# Patient Record
Sex: Male | Born: 1970 | Race: Asian | Hispanic: No
Health system: Southern US, Community
[De-identification: ages and names within clinical notes are randomized; demographics above are authoritative.]

## PROBLEM LIST (undated history)

## (undated) DIAGNOSIS — I10 Essential (primary) hypertension: Secondary | ICD-10-CM

---

## 2005-08-31 ENCOUNTER — Emergency Department (HOSPITAL_COMMUNITY): Admission: EM | Admit: 2005-08-31 | Discharge: 2005-08-31 | Payer: Self-pay | Admitting: Emergency Medicine

## 2006-06-15 IMAGING — CR DG FOOT COMPLETE 3+V*L*
3 series · 3 of 3 positions shown · non-contrast
Comparison: none

CLINICAL DATA: Fall.  Left ankle and foot injury.  Foot pain.
 LEFT FOOT ? 3 VIEW:
 There is no evidence of fracture or dislocation.  Small plantar calcaneal spur is noted.  No other significant bone abnormalities are identified.

[t foot ap left]
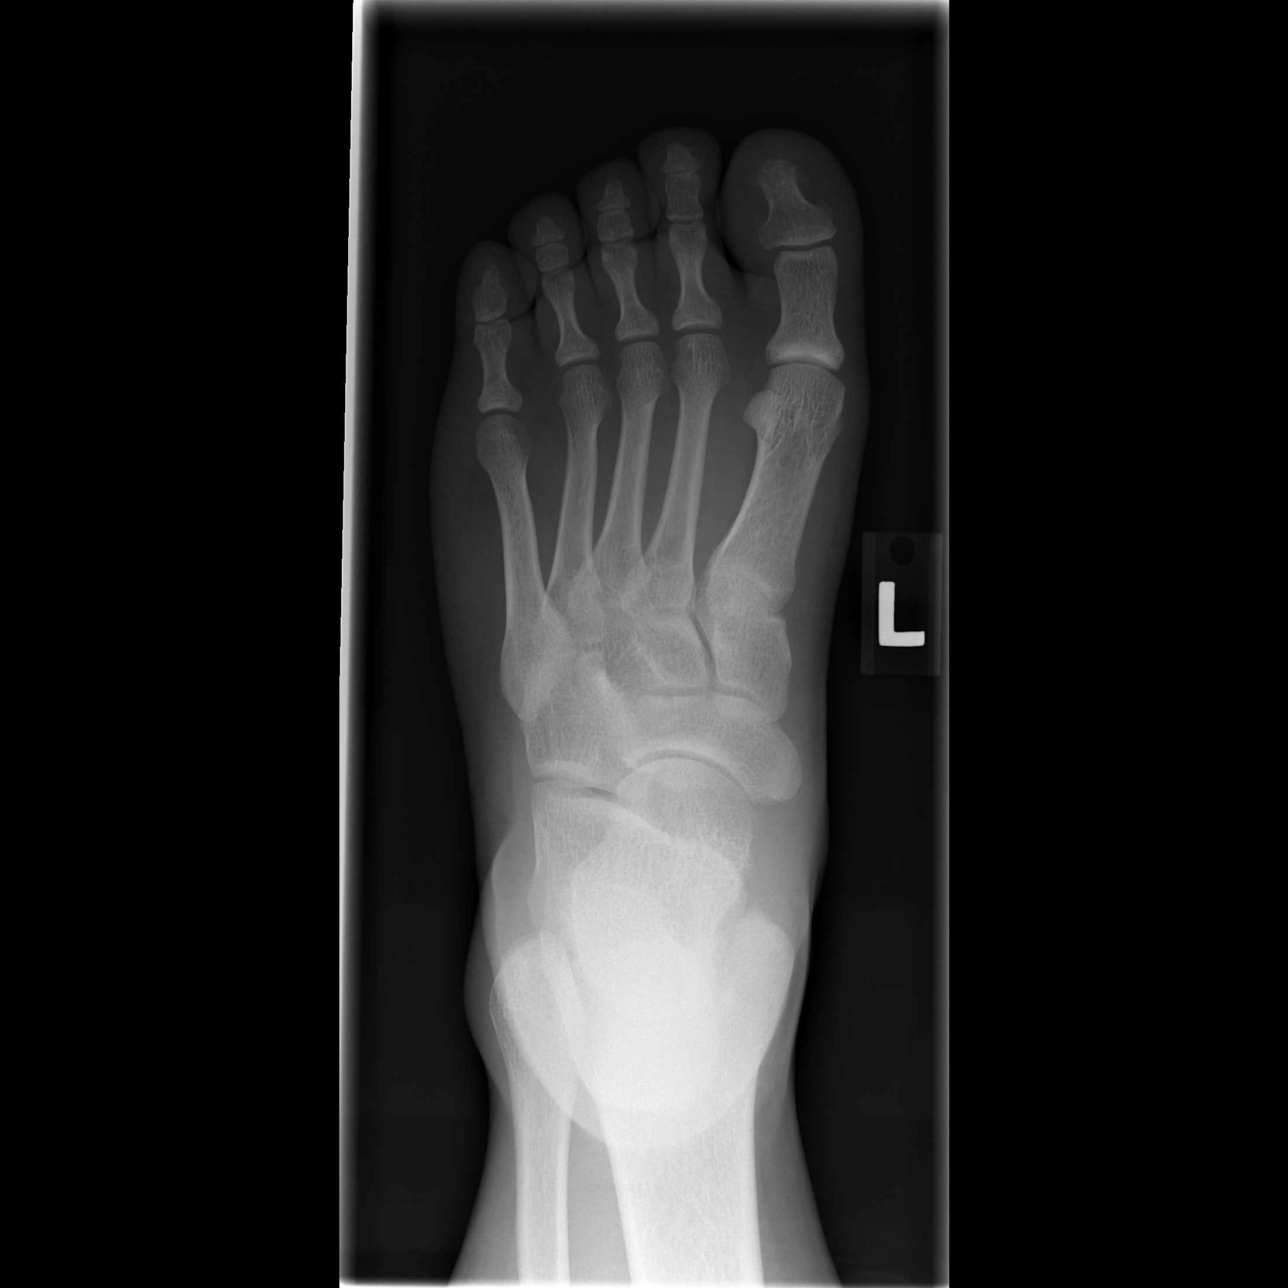

[t foot oblique left]
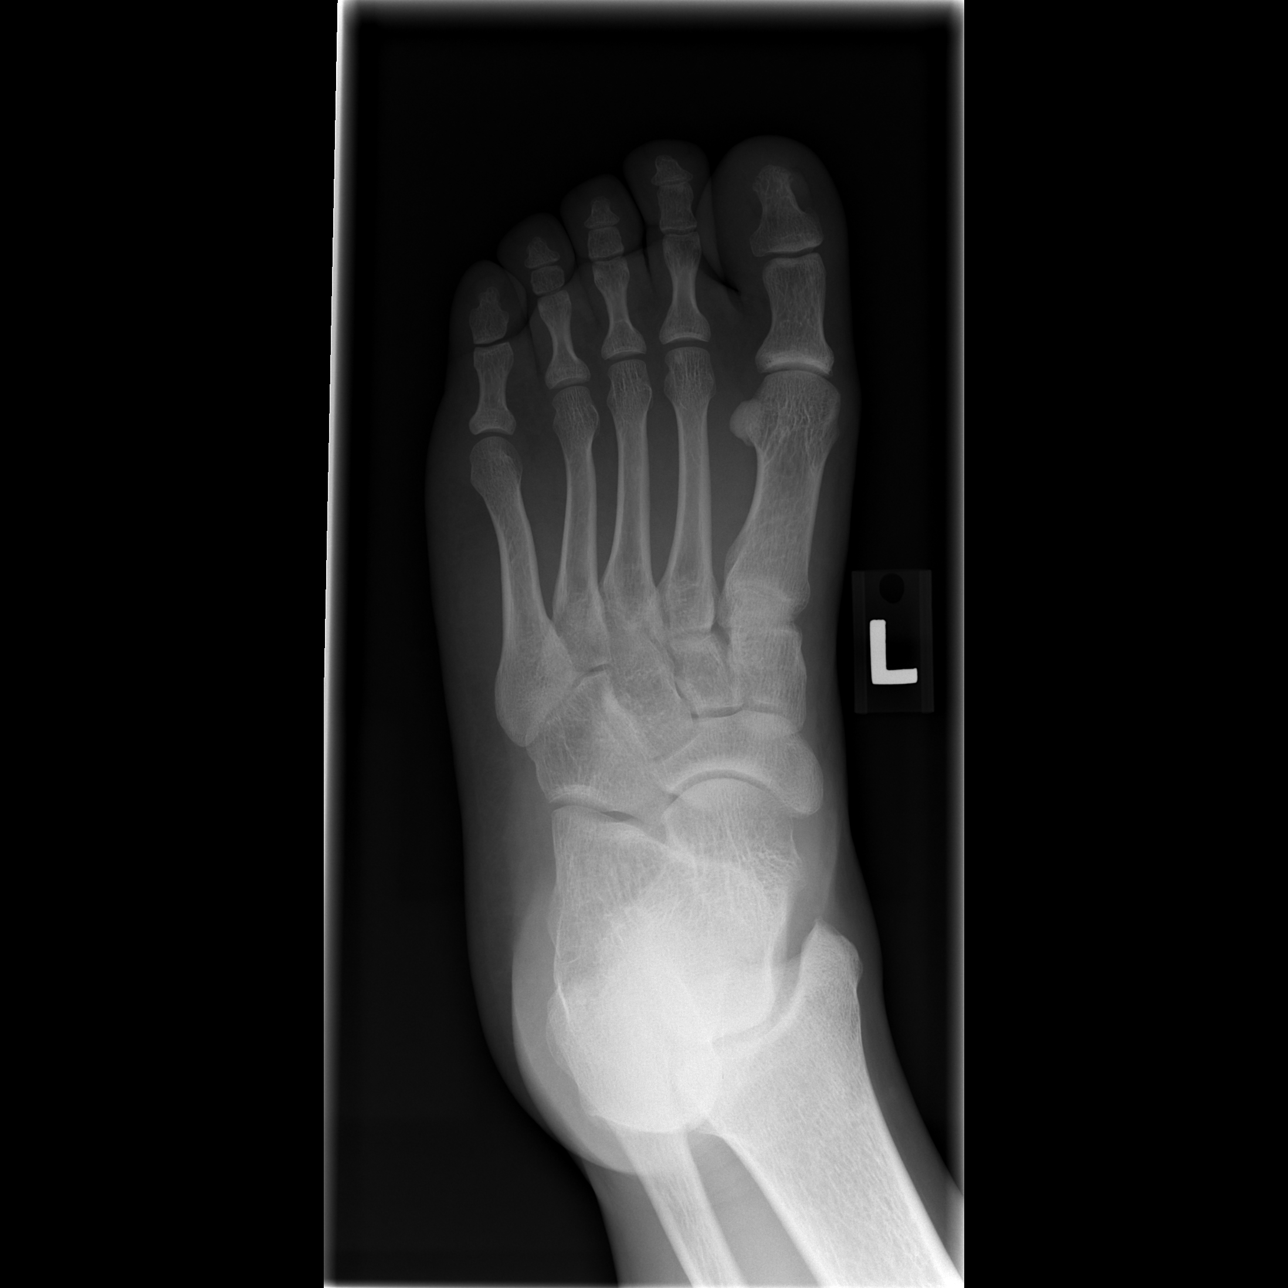

[t foot lat left]
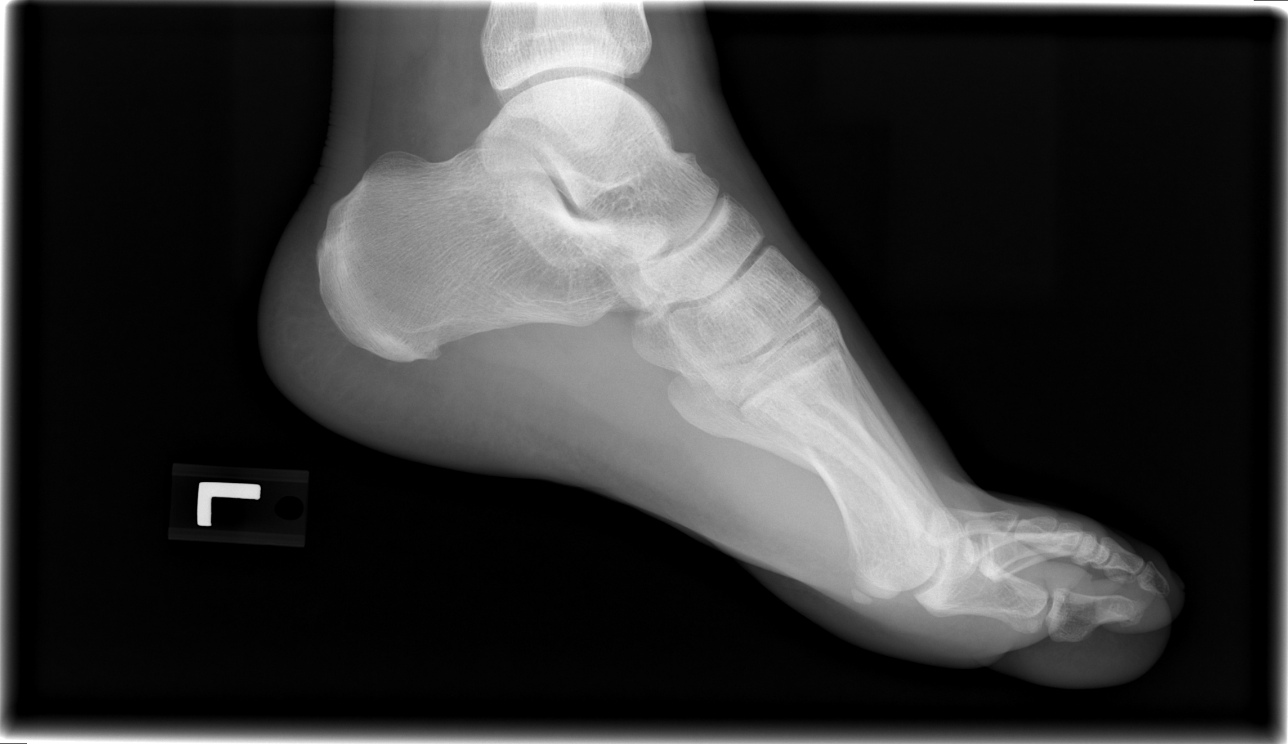

[3 of 3 positions shown; findings below may reference images not displayed]

IMPRESSION: 1.  No acute findings.  
 2.  Small plantar calcaneal spur.

## 2006-06-15 IMAGING — CR DG ANKLE COMPLETE 3+V*L*
3 series · 3 of 3 positions shown · non-contrast
Comparison: none

CLINICAL DATA: Fall.  Left ankle injury and pain.
 LEFT ANKLE ? 3 VIEW:
 There is no evidence of fracture, dislocation, or joint effusion.  There is no evidence of arthropathy or other focal bone abnormality.  Soft tissues are unremarkable.

[t ankle joint ap left]
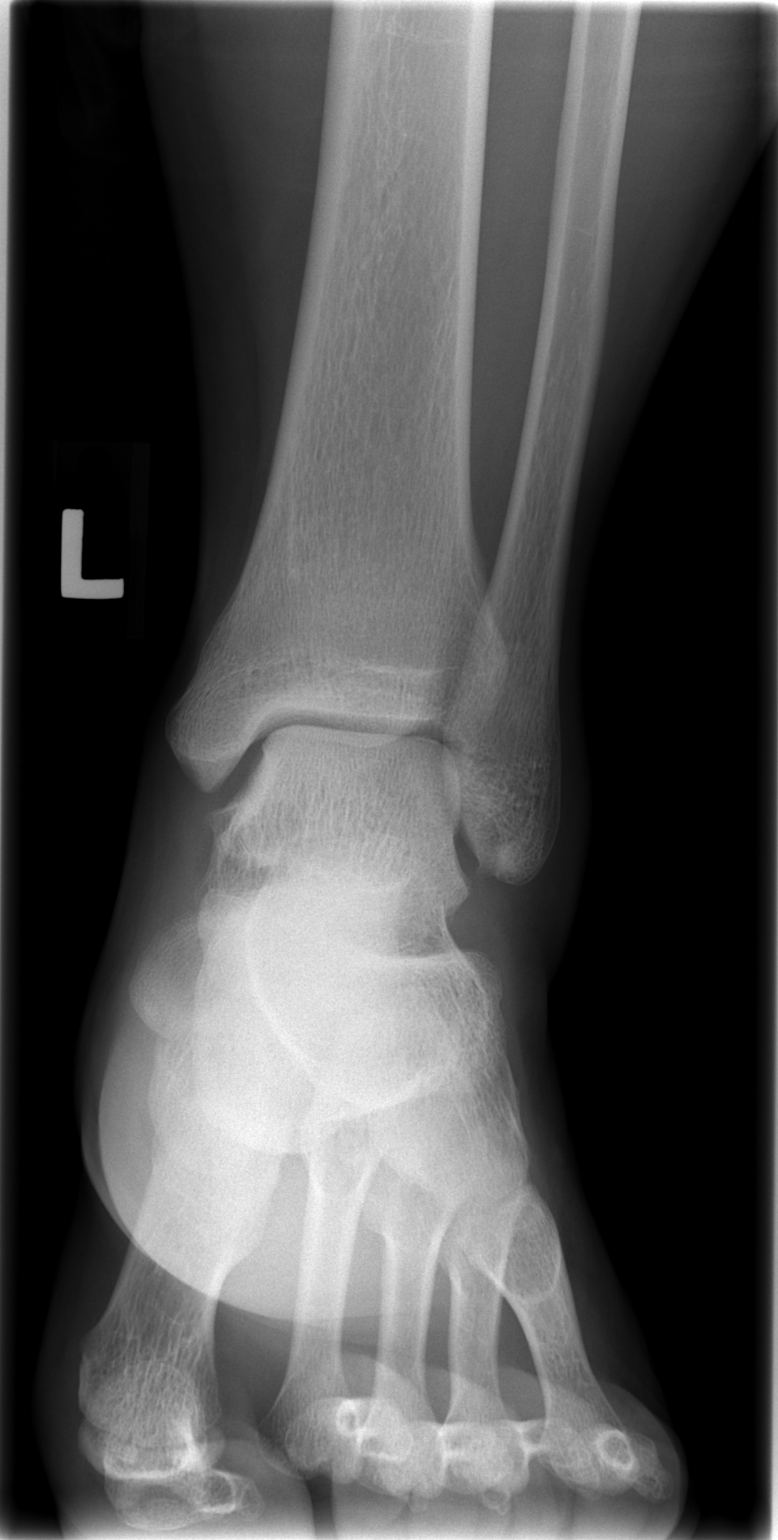

[t ankle joint oblique left]
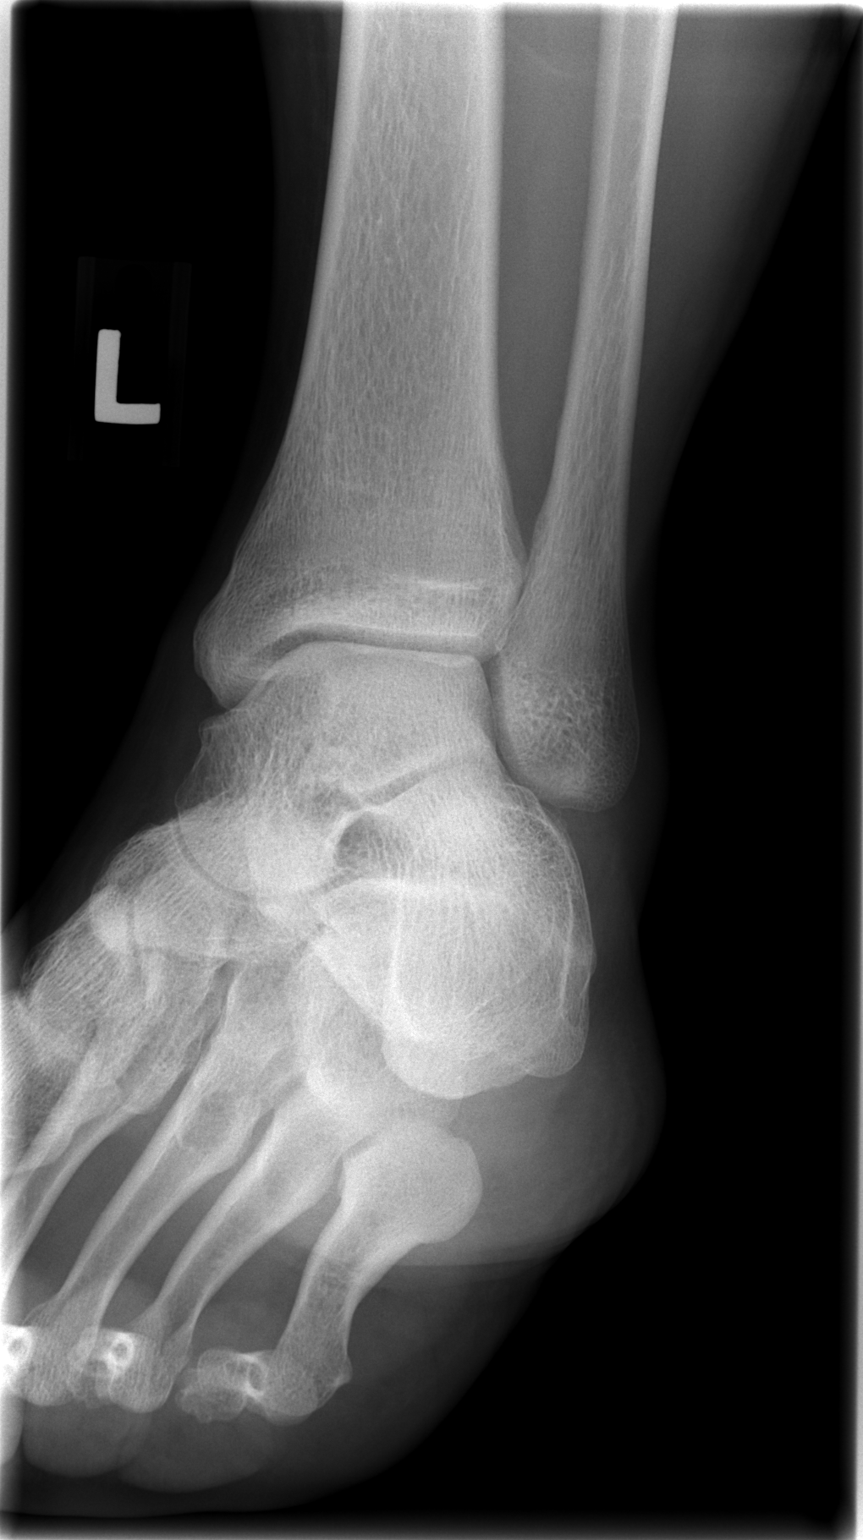

[t ankle joint lat left]
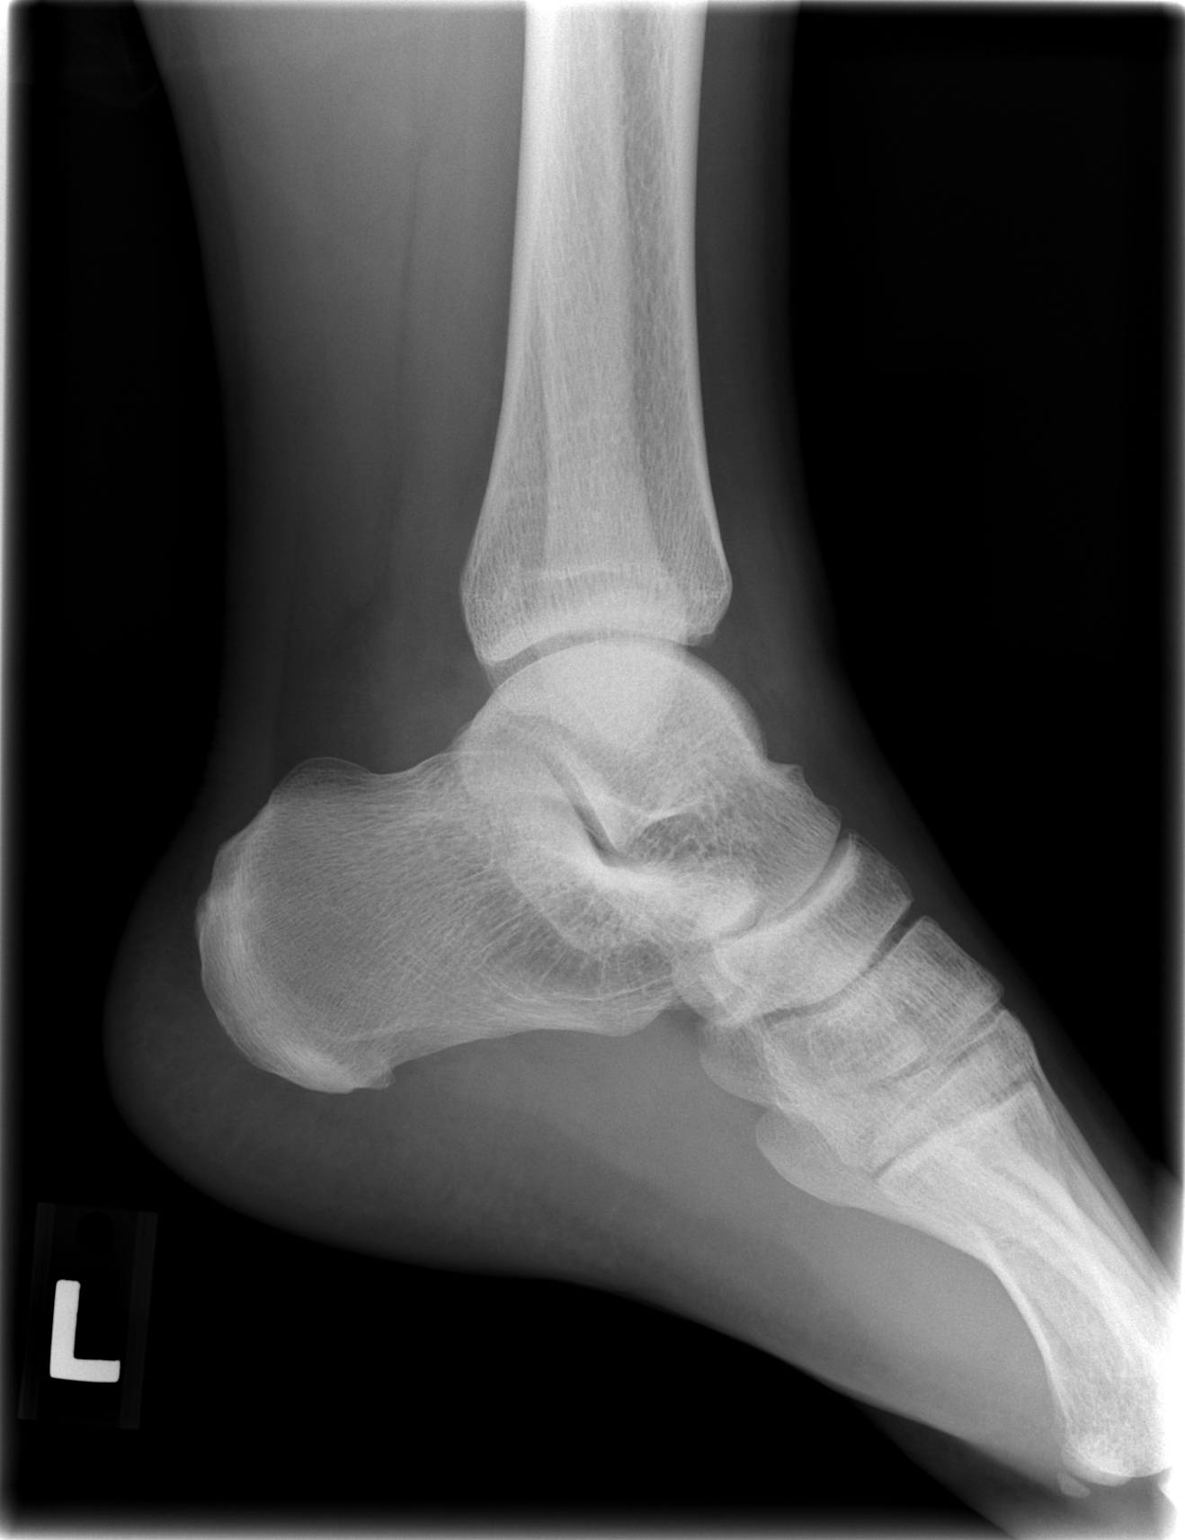

[3 of 3 positions shown; findings below may reference images not displayed]

IMPRESSION: Negative.

## 2017-04-08 DIAGNOSIS — Z23 Encounter for immunization: Secondary | ICD-10-CM | POA: Diagnosis not present

## 2017-04-08 DIAGNOSIS — Z0000 Encounter for general adult medical examination without abnormal findings: Secondary | ICD-10-CM | POA: Diagnosis not present

## 2017-04-10 DIAGNOSIS — Z23 Encounter for immunization: Secondary | ICD-10-CM | POA: Diagnosis not present

## 2018-04-16 DIAGNOSIS — Z0000 Encounter for general adult medical examination without abnormal findings: Secondary | ICD-10-CM | POA: Diagnosis not present

## 2018-04-16 DIAGNOSIS — Z131 Encounter for screening for diabetes mellitus: Secondary | ICD-10-CM | POA: Diagnosis not present

## 2019-12-15 ENCOUNTER — Ambulatory Visit: Payer: 59 | Attending: Internal Medicine

## 2019-12-15 DIAGNOSIS — Z23 Encounter for immunization: Secondary | ICD-10-CM

## 2019-12-15 NOTE — Progress Notes (Signed)
   Covid-19 Vaccination Clinic  Name:  Robert Richardson    MRN: [id number] DOB: 04-08-1971  12/15/2019  [name] was observed post Covid-19 immunization for 15 minutes without incident. He was provided with Vaccine Information Sheet and instruction to access the V-Safe system.   [name] was instructed to call 911 with any severe reactions post vaccine: Marland Kitchen Difficulty breathing  . Swelling of face and throat  . A fast heartbeat  . A bad rash all over body  . Dizziness and weakness   Immunizations Administered    Name Date Dose VIS Date Route   Pfizer COVID-19 Vaccine 12/15/2019 11:14 AM 0.3 mL 09/14/2018 Intramuscular   Manufacturer: [employer], [employer]   Lot: [id number]   NDC: [address]-7

## 2020-01-09 ENCOUNTER — Ambulatory Visit: Payer: 59 | Attending: Internal Medicine

## 2020-01-09 DIAGNOSIS — Z23 Encounter for immunization: Secondary | ICD-10-CM

## 2020-01-09 NOTE — Progress Notes (Signed)
   Covid-19 Vaccination Clinic  Name:  Robert Richardson    MRN: [id number] DOB: 07/14/1971  01/09/2020  [name] was observed post Covid-19 immunization for 15 minutes without incident. He was provided with Vaccine Information Sheet and instruction to access the V-Safe system.   [name] was instructed to call 911 with any severe reactions post vaccine: Marland Kitchen Difficulty breathing  . Swelling of face and throat  . A fast heartbeat  . A bad rash all over body  . Dizziness and weakness   Immunizations Administered    Name Date Dose VIS Date Route   Pfizer COVID-19 Vaccine 01/09/2020  2:45 PM 0.3 mL 09/14/2018 Intramuscular   Manufacturer: [employer], [employer]   Lot: [id number]   NDC: [address]-4

## 2022-06-10 ENCOUNTER — Other Ambulatory Visit: Payer: Self-pay | Admitting: Family Medicine

## 2022-06-10 DIAGNOSIS — E041 Nontoxic single thyroid nodule: Secondary | ICD-10-CM

## 2022-07-29 ENCOUNTER — Ambulatory Visit (INDEPENDENT_AMBULATORY_CARE_PROVIDER_SITE_OTHER): Payer: 59

## 2022-07-29 DIAGNOSIS — E041 Nontoxic single thyroid nodule: Secondary | ICD-10-CM | POA: Diagnosis not present

## 2023-08-18 ENCOUNTER — Other Ambulatory Visit: Payer: Self-pay | Admitting: Urology

## 2023-10-06 NOTE — Patient Instructions (Signed)
 SURGICAL WAITING ROOM VISITATION  Patients having surgery or a procedure may have no more than 2 support people in the waiting area - these visitors may rotate.    Children under the age of 13 must have an adult with them who is not the patient.  Due to an increase in RSV and influenza rates and associated hospitalizations, children ages 20 and under may not visit patients in [facility] hospitals.  Visitors with respiratory illnesses are discouraged from visiting and should remain at home.  If the patient needs to stay at the hospital during part of their recovery, the visitor guidelines for inpatient rooms apply. Pre-op nurse will coordinate an appropriate time for 1 support person to accompany patient in pre-op.  This support person may not rotate.    Please refer to the [facility] website for the visitor guidelines for Inpatients (after your surgery is over and you are in a regular room).       Your procedure is scheduled on:  10/15/23    Report to [facility] Main Entrance    Report to admitting at   [phone]   Call this number if you have problems the morning of surgery [phone]   Do not eat food or drink liquids  :After Midnight.                 If you have questions, please contact your surgeon's office.      Oral Hygiene is also important to reduce your risk of infection.                                    Remember - BRUSH YOUR TEETH THE MORNING OF SURGERY WITH YOUR REGULAR TOOTHPASTE  DENTURES WILL BE REMOVED PRIOR TO SURGERY PLEASE DO NOT APPLY "Poly grip" OR ADHESIVES!!!   Do NOT smoke after Midnight   Stop all vitamins and herbal supplements 7 days before surgery.   Take these medicines the morning of surgery with A SIP OF WATER:  amlodipine   DO NOT TAKE ANY ORAL DIABETIC MEDICATIONS DAY OF YOUR SURGERY  Bring CPAP mask and tubing day of surgery.                              You may not have any metal on your body including hair pins,  jewelry, and body piercing             Do not wear make-up, lotions, powders, perfumes/cologne, or deodorant  Do not wear nail polish including gel and S&S, artificial/acrylic nails, or any other type of covering on natural nails including finger and toenails. If you have artificial nails, gel coating, etc. that needs to be removed by a nail salon please have this removed prior to surgery or surgery may need to be canceled/ delayed if the surgeon/ anesthesia feels like they are unable to be safely monitored.   Do not shave  48 hours prior to surgery.               Men may shave face and neck.   Do not bring valuables to the hospital.  IS NOT             RESPONSIBLE   FOR VALUABLES.   Contacts, glasses, dentures or bridgework may not be worn into surgery.   Bring small overnight bag day of  surgery.   DO NOT BRING YOUR HOME MEDICATIONS TO THE HOSPITAL. PHARMACY WILL DISPENSE MEDICATIONS LISTED ON YOUR MEDICATION LIST TO YOU DURING YOUR ADMISSION IN THE HOSPITAL!    Patients discharged on the day of surgery will not be allowed to drive home.  Someone NEEDS to stay with you for the first 24 hours after anesthesia.   Special Instructions: Bring a copy of your healthcare power of attorney and living will documents the day of surgery if you haven't scanned them before.              Please read over the following fact sheets you were given: IF YOU HAVE QUESTIONS ABOUT YOUR PRE-OP INSTRUCTIONS PLEASE CALL [phone]   If you received a COVID test during your pre-op visit  it is requested that you wear a mask when out in public, stay away from anyone that may not be feeling well and notify your surgeon if you develop symptoms. If you test positive for Covid or have been in contact with anyone that has tested positive in the last 10 days please notify you surgeon.    [facility] - Preparing for Surgery Before surgery, you can play an important role.  Because skin is not sterile, your  skin needs to be as free of germs as possible.  You can reduce the number of germs on your skin by washing with CHG (chlorahexidine gluconate) soap before surgery.  CHG is an antiseptic cleaner which kills germs and bonds with the skin to continue killing germs even after washing. Please DO NOT use if you have an allergy to CHG or antibacterial soaps.  If your skin becomes reddened/irritated stop using the CHG and inform your nurse when you arrive at Short Stay. Do not shave (including legs and underarms) for at least 48 hours prior to the first CHG shower.  You may shave your face/neck. Please follow these instructions carefully:  1.  Shower with CHG Soap the night before surgery and the  morning of Surgery.  2.  If you choose to wash your hair, wash your hair first as usual with your  normal  shampoo.  3.  After you shampoo, rinse your hair and body thoroughly to remove the  shampoo.                           4.  Use CHG as you would any other liquid soap.  You can apply chg directly  to the skin and wash                       Gently with a scrungie or clean washcloth.  5.  Apply the CHG Soap to your body ONLY FROM THE NECK DOWN.   Do not use on face/ open                           Wound or open sores. Avoid contact with eyes, ears mouth and genitals (private parts).                       Wash face,  Genitals (private parts) with your normal soap.             6.  Wash thoroughly, paying special attention to the area where your surgery  will be performed.  7.  Thoroughly rinse your body with warm water from the neck  down.  8.  DO NOT shower/wash with your normal soap after using and rinsing off  the CHG Soap.                9.  Pat yourself dry with a clean towel.            10.  Wear clean pajamas.            11.  Place clean sheets on your bed the night of your first shower and do not  sleep with pets. Day of Surgery : Do not apply any lotions/deodorants the morning of surgery.  Please wear  clean clothes to the hospital/surgery center.  FAILURE TO FOLLOW THESE INSTRUCTIONS MAY RESULT IN THE CANCELLATION OF YOUR SURGERY PATIENT SIGNATURE_________________________________  NURSE SIGNATURE__________________________________  ________________________________________________________________________

## 2023-10-09 ENCOUNTER — Other Ambulatory Visit: Payer: Self-pay

## 2023-10-09 ENCOUNTER — Encounter (HOSPITAL_COMMUNITY)
Admission: RE | Admit: 2023-10-09 | Discharge: 2023-10-09 | Disposition: A | Discharge status: Home / Self Care | Payer: 59 | Source: Ambulatory Visit | Attending: Urology | Admitting: Urology

## 2023-10-09 ENCOUNTER — Encounter (HOSPITAL_COMMUNITY): Payer: Self-pay

## 2023-10-09 VITALS — BP 108/71 | HR 66 | Temp 98.5°F | Resp 16 | Ht 67.0 in | Wt 160.0 lb

## 2023-10-09 DIAGNOSIS — Z01818 Encounter for other preprocedural examination: Secondary | ICD-10-CM | POA: Insufficient documentation

## 2023-10-09 DIAGNOSIS — Z01812 Encounter for preprocedural laboratory examination: Secondary | ICD-10-CM | POA: Diagnosis present

## 2023-10-09 DIAGNOSIS — Z1389 Encounter for screening for other disorder: Secondary | ICD-10-CM | POA: Diagnosis not present

## 2023-10-09 DIAGNOSIS — Z01810 Encounter for preprocedural cardiovascular examination: Secondary | ICD-10-CM | POA: Diagnosis present

## 2023-10-09 HISTORY — DX: Essential (primary) hypertension: I10

## 2023-10-09 LAB — COMPREHENSIVE METABOLIC PANEL
ALT: 27 U/L (ref 0–44)
AST: 22 U/L (ref 15–41)
Albumin: 4.4 g/dL (ref 3.5–5.0)
Alkaline Phosphatase: 55 U/L (ref 38–126)
Anion gap: 9 (ref 5–15)
BUN: 16 mg/dL (ref 6–20)
CO2: 26 mmol/L (ref 22–32)
Calcium: 9.4 mg/dL (ref 8.9–10.3)
Chloride: 102 mmol/L (ref 98–111)
Creatinine, Ser: 0.81 mg/dL (ref 0.61–1.24)
GFR, Estimated: >60 mL/min (ref >60)
Glucose, Bld: 98 mg/dL (ref 70–99)
Potassium: 3.9 mmol/L (ref 3.5–5.1)
Sodium: 137 mmol/L (ref 135–145)
Total Bilirubin: 1 mg/dL (ref 0.0–1.2)
Total Protein: 7.3 g/dL (ref 6.5–8.1)

## 2023-10-09 LAB — CBC
HCT: 41.3 % (ref 39.0–52.0)
Hemoglobin: 14 g/dL (ref 13.0–17.0)
MCH: 31.7 pg (ref 26.0–34.0)
MCHC: 33.9 g/dL (ref 30.0–36.0)
MCV: 93.7 fL (ref 80.0–100.0)
Platelets: 261 10*3/uL (ref 150–400)
RBC: 4.41 MIL/uL (ref 4.22–5.81)
RDW: 11.7 % (ref 11.5–15.5)
WBC: 5 10*3/uL (ref 4.0–10.5)
nRBC: 0 % (ref 0.0–0.2)

## 2023-10-09 NOTE — Progress Notes (Addendum)
 Anesthesia Review:  PCP: Joycelyn Rua  Cardiologist : none   PPM/ ICD: Device Orders: Rep Notified:  Chest x-ray : EKG : 10/09/23  Echo : Stress test: Cardiac Cath :   Activity level: can do a flight of stairs wtihout difficutly  Sleep Study/ CPAP : none  Fasting Blood Sugar :      / Checks Blood Sugar -- times a day:    Blood Thinner/ Instructions /Last Dose: ASA / Instructions/ Last Dose :    [place] English and so does wife.

## 2023-10-10 LAB — URINE CULTURE: Culture: NO GROWTH

## 2023-10-15 ENCOUNTER — Other Ambulatory Visit: Payer: Self-pay

## 2023-10-15 ENCOUNTER — Observation Stay (HOSPITAL_COMMUNITY)
Admission: RE | Admit: 2023-10-15 | Discharge: 2023-10-16 | Disposition: A | Discharge status: Home / Self Care | Payer: 59 | Attending: Urology | Admitting: Urology

## 2023-10-15 ENCOUNTER — Ambulatory Visit (HOSPITAL_COMMUNITY): Payer: Self-pay | Admitting: Certified Registered Nurse Anesthetist

## 2023-10-15 ENCOUNTER — Encounter (HOSPITAL_COMMUNITY): Payer: Self-pay | Admitting: Urology

## 2023-10-15 ENCOUNTER — Encounter (HOSPITAL_COMMUNITY)
Admission: RE | Disposition: A | Discharge status: Home / Self Care | Payer: Self-pay | Source: Home / Self Care | Attending: Urology

## 2023-10-15 ENCOUNTER — Ambulatory Visit (HOSPITAL_BASED_OUTPATIENT_CLINIC_OR_DEPARTMENT_OTHER): Payer: Self-pay | Admitting: Certified Registered Nurse Anesthetist

## 2023-10-15 DIAGNOSIS — Z23 Encounter for immunization: Secondary | ICD-10-CM | POA: Insufficient documentation

## 2023-10-15 DIAGNOSIS — Z01818 Encounter for other preprocedural examination: Secondary | ICD-10-CM

## 2023-10-15 DIAGNOSIS — I10 Essential (primary) hypertension: Secondary | ICD-10-CM | POA: Diagnosis not present

## 2023-10-15 DIAGNOSIS — N2889 Other specified disorders of kidney and ureter: Principal | ICD-10-CM | POA: Insufficient documentation

## 2023-10-15 DIAGNOSIS — Z79899 Other long term (current) drug therapy: Secondary | ICD-10-CM | POA: Diagnosis not present

## 2023-10-15 HISTORY — PX: ROBOT ASSISTED LAPAROSCOPIC NEPHRECTOMY: SHX5140

## 2023-10-15 LAB — BASIC METABOLIC PANEL WITH GFR
Anion gap: 8 (ref 5–15)
BUN: 15 mg/dL (ref 6–20)
CO2: 24 mmol/L (ref 22–32)
Calcium: 8.5 mg/dL — ABNORMAL LOW (ref 8.9–10.3)
Chloride: 104 mmol/L (ref 98–111)
Creatinine, Ser: 0.91 mg/dL (ref 0.61–1.24)
GFR, Estimated: >60 mL/min (ref >60)
Glucose, Bld: 161 mg/dL — ABNORMAL HIGH (ref 70–99)
Potassium: 3.8 mmol/L (ref 3.5–5.1)
Sodium: 136 mmol/L (ref 135–145)

## 2023-10-15 LAB — TYPE AND SCREEN
ABO/RH(D): B POS
Antibody Screen: NEGATIVE

## 2023-10-15 LAB — HEMOGLOBIN AND HEMATOCRIT, BLOOD
HCT: 38.5 % — ABNORMAL LOW (ref 39.0–52.0)
Hemoglobin: 12.7 g/dL — ABNORMAL LOW (ref 13.0–17.0)

## 2023-10-15 LAB — ABO/RH: ABO/RH(D): B POS

## 2023-10-15 SURGERY — NEPHRECTOMY, RADICAL, ROBOT-ASSISTED, LAPAROSCOPIC, ADULT
Anesthesia: General | Laterality: Right

## 2023-10-15 MED ORDER — KCL IN DEXTROSE-NACL 20-5-0.45 MEQ/L-%-% IV SOLN
INTRAVENOUS | Status: AC
  Filled 2023-10-15: qty 1000

## 2023-10-15 MED ORDER — LACTATED RINGERS IV SOLN: INTRAVENOUS | Status: DC | PRN

## 2023-10-15 MED ORDER — ACETAMINOPHEN 10 MG/ML IV SOLN
1000.0000 mg | Freq: Four times a day (QID) | INTRAVENOUS | Status: AC
  Administered 2023-10-15 – 2023-10-16 (×4): 1000 mg via INTRAVENOUS
  Filled 2023-10-15 (×4): qty 100

## 2023-10-15 MED ORDER — SENNOSIDES-DOCUSATE SODIUM 8.6-50 MG PO TABS
2.0000 | ORAL_TABLET | Freq: Every day | ORAL | Status: DC
  Administered 2023-10-15: 2 via ORAL
  Filled 2023-10-15: qty 2

## 2023-10-15 MED ORDER — BUPIVACAINE-EPINEPHRINE 0.5% -1:200000 IJ SOLN
INTRAMUSCULAR | Status: DC | PRN
  Administered 2023-10-15: 11 mL

## 2023-10-15 MED ORDER — CHLORHEXIDINE GLUCONATE 0.12 % MT SOLN
15.0000 mL | Freq: Once | OROMUCOSAL | Status: AC
  Administered 2023-10-15: 15 mL via OROMUCOSAL

## 2023-10-15 MED ORDER — INFLUENZA VIRUS VACC SPLIT PF (FLUZONE) 0.5 ML IM SUSY
0.5000 mL | PREFILLED_SYRINGE | INTRAMUSCULAR | Status: AC
  Administered 2023-10-16: 0.5 mL via INTRAMUSCULAR
  Filled 2023-10-15: qty 0.5

## 2023-10-15 MED ORDER — CEFAZOLIN SODIUM-DEXTROSE 2-4 GM/100ML-% IV SOLN
2.0000 g | INTRAVENOUS | Status: AC
  Administered 2023-10-15: 2 g via INTRAVENOUS
  Filled 2023-10-15: qty 100

## 2023-10-15 MED ORDER — FENTANYL CITRATE (PF) 100 MCG/2ML IJ SOLN
INTRAMUSCULAR | Status: DC | PRN
  Administered 2023-10-15 (×6): 50 ug via INTRAVENOUS

## 2023-10-15 MED ORDER — OXYCODONE HCL 5 MG PO TABS: 5.0000 mg | ORAL_TABLET | Freq: Once | ORAL | Status: DC | PRN

## 2023-10-15 MED ORDER — MIDAZOLAM HCL 5 MG/5ML IJ SOLN
INTRAMUSCULAR | Status: DC | PRN
  Administered 2023-10-15: 2 mg via INTRAVENOUS

## 2023-10-15 MED ORDER — OXYCODONE HCL 5 MG/5ML PO SOLN: 5.0000 mg | Freq: Once | ORAL | Status: DC | PRN

## 2023-10-15 MED ORDER — PHENYLEPHRINE 80 MCG/ML (10ML) SYRINGE FOR IV PUSH (FOR BLOOD PRESSURE SUPPORT)
PREFILLED_SYRINGE | INTRAVENOUS | Status: DC | PRN
  Administered 2023-10-15: 80 ug via INTRAVENOUS
  Administered 2023-10-15 (×2): 160 ug via INTRAVENOUS
  Administered 2023-10-15: 80 ug via INTRAVENOUS

## 2023-10-15 MED ORDER — ORAL CARE MOUTH RINSE: 15.0000 mL | Freq: Once | OROMUCOSAL | Status: AC

## 2023-10-15 MED ORDER — ONDANSETRON HCL 4 MG/2ML IJ SOLN: 4.0000 mg | Freq: Four times a day (QID) | INTRAMUSCULAR | Status: DC | PRN

## 2023-10-15 MED ORDER — PHENYLEPHRINE HCL-NACL 20-0.9 MG/250ML-% IV SOLN
INTRAVENOUS | Status: DC | PRN
Start: 2023-10-15 — End: 2023-10-15
  Administered 2023-10-15: 40 ug/min via INTRAVENOUS

## 2023-10-15 MED ORDER — SUGAMMADEX SODIUM 200 MG/2ML IV SOLN
INTRAVENOUS | Status: DC | PRN
  Administered 2023-10-15: 200 mg via INTRAVENOUS

## 2023-10-15 MED ORDER — FENTANYL CITRATE (PF) 100 MCG/2ML IJ SOLN
INTRAMUSCULAR | Status: AC
  Filled 2023-10-15: qty 2

## 2023-10-15 MED ORDER — LIDOCAINE HCL (PF) 2 % IJ SOLN
INTRAMUSCULAR | Status: AC
  Filled 2023-10-15: qty 5

## 2023-10-15 MED ORDER — ROCURONIUM BROMIDE 10 MG/ML (PF) SYRINGE
PREFILLED_SYRINGE | INTRAVENOUS | Status: AC
  Filled 2023-10-15: qty 10

## 2023-10-15 MED ORDER — HYDROMORPHONE HCL 1 MG/ML IJ SOLN: 0.5000 mg | INTRAMUSCULAR | Status: DC | PRN

## 2023-10-15 MED ORDER — BUPIVACAINE LIPOSOME 1.3 % IJ SUSP
INTRAMUSCULAR | Status: AC
  Filled 2023-10-15: qty 10

## 2023-10-15 MED ORDER — OXYBUTYNIN CHLORIDE 5 MG PO TABS
5.0000 mg | ORAL_TABLET | Freq: Three times a day (TID) | ORAL | Status: DC | PRN
  Administered 2023-10-15: 5 mg via ORAL
  Filled 2023-10-15: qty 1

## 2023-10-15 MED ORDER — ROCURONIUM BROMIDE 10 MG/ML (PF) SYRINGE
PREFILLED_SYRINGE | INTRAVENOUS | Status: DC | PRN
  Administered 2023-10-15 (×2): 20 mg via INTRAVENOUS
  Administered 2023-10-15: 50 mg via INTRAVENOUS
  Administered 2023-10-15: 20 mg via INTRAVENOUS
  Administered 2023-10-15: 30 mg via INTRAVENOUS

## 2023-10-15 MED ORDER — SUGAMMADEX SODIUM 200 MG/2ML IV SOLN
INTRAVENOUS | Status: AC
  Filled 2023-10-15: qty 2

## 2023-10-15 MED ORDER — TRAMADOL HCL 50 MG PO TABS: 50.0000 mg | ORAL_TABLET | Freq: Four times a day (QID) | ORAL | 0 refills | Status: DC | PRN

## 2023-10-15 MED ORDER — BUPIVACAINE-EPINEPHRINE (PF) 0.5% -1:200000 IJ SOLN
INTRAMUSCULAR | Status: AC
  Filled 2023-10-15: qty 30

## 2023-10-15 MED ORDER — BUPIVACAINE LIPOSOME 1.3 % IJ SUSP
INTRAMUSCULAR | Status: DC | PRN
  Administered 2023-10-15: 20 mL

## 2023-10-15 MED ORDER — DEXAMETHASONE SODIUM PHOSPHATE 10 MG/ML IJ SOLN
INTRAMUSCULAR | Status: DC | PRN
  Administered 2023-10-15: 10 mg via INTRAVENOUS

## 2023-10-15 MED ORDER — TRAMADOL HCL 50 MG PO TABS
50.0000 mg | ORAL_TABLET | Freq: Four times a day (QID) | ORAL | Status: DC | PRN
  Administered 2023-10-15: 50 mg via ORAL
  Administered 2023-10-15 – 2023-10-16 (×2): 100 mg via ORAL
  Filled 2023-10-15 (×2): qty 2
  Filled 2023-10-15: qty 1

## 2023-10-15 MED ORDER — ONDANSETRON HCL 4 MG/2ML IJ SOLN
INTRAMUSCULAR | Status: DC | PRN
  Administered 2023-10-15: 4 mg via INTRAVENOUS

## 2023-10-15 MED ORDER — AMLODIPINE BESYLATE 5 MG PO TABS
5.0000 mg | ORAL_TABLET | Freq: Every day | ORAL | Status: DC
  Administered 2023-10-16: 5 mg via ORAL
  Filled 2023-10-15 (×2): qty 1

## 2023-10-15 MED ORDER — DOCUSATE SODIUM 100 MG PO CAPS: 100.0000 mg | ORAL_CAPSULE | Freq: Two times a day (BID) | ORAL | Status: DC

## 2023-10-15 MED ORDER — FENTANYL CITRATE PF 50 MCG/ML IJ SOSY: 25.0000 ug | PREFILLED_SYRINGE | INTRAMUSCULAR | Status: DC | PRN

## 2023-10-15 MED ORDER — SODIUM CHLORIDE (PF) 0.9 % IJ SOLN
INTRAMUSCULAR | Status: AC
  Filled 2023-10-15: qty 20

## 2023-10-15 MED ORDER — MIDAZOLAM HCL 2 MG/2ML IJ SOLN
INTRAMUSCULAR | Status: AC
  Filled 2023-10-15: qty 2

## 2023-10-15 MED ORDER — ONDANSETRON HCL 4 MG/2ML IJ SOLN
4.0000 mg | INTRAMUSCULAR | Status: DC | PRN
  Administered 2023-10-15: 4 mg via INTRAVENOUS
  Filled 2023-10-15: qty 2

## 2023-10-15 MED ORDER — FENTANYL CITRATE (PF) 100 MCG/2ML IJ SOLN
INTRAMUSCULAR | Status: AC
Start: 2023-10-15 — End: ?
  Filled 2023-10-15: qty 2

## 2023-10-15 MED ORDER — LACTATED RINGERS IV SOLN: INTRAVENOUS | Status: DC

## 2023-10-15 MED ORDER — PROPOFOL 10 MG/ML IV BOLUS
INTRAVENOUS | Status: DC | PRN
  Administered 2023-10-15: 150 mg via INTRAVENOUS

## 2023-10-15 MED ORDER — PROPOFOL 10 MG/ML IV BOLUS
INTRAVENOUS | Status: AC
  Filled 2023-10-15: qty 20

## 2023-10-15 MED ORDER — BUPIVACAINE LIPOSOME 1.3 % IJ SUSP
INTRAMUSCULAR | Status: AC
  Filled 2023-10-15: qty 20

## 2023-10-15 MED ORDER — EPHEDRINE SULFATE-NACL 50-0.9 MG/10ML-% IV SOSY
PREFILLED_SYRINGE | INTRAVENOUS | Status: DC | PRN
  Administered 2023-10-15 (×2): 10 mg via INTRAVENOUS
  Administered 2023-10-15: 5 mg via INTRAVENOUS

## 2023-10-15 MED ORDER — ONDANSETRON HCL 4 MG/2ML IJ SOLN
INTRAMUSCULAR | Status: AC
  Filled 2023-10-15: qty 2

## 2023-10-15 MED ORDER — DEXAMETHASONE SODIUM PHOSPHATE 10 MG/ML IJ SOLN
INTRAMUSCULAR | Status: AC
  Filled 2023-10-15: qty 1

## 2023-10-15 SURGICAL SUPPLY — 65 items
APPLICATOR SURGIFLO ENDO (HEMOSTASIS) ×1 IMPLANT
BAG COUNTER SPONGE SURGICOUNT (BAG) IMPLANT
BAG LAPAROSCOPIC 12 15 PORT 16 (BASKET) ×1 IMPLANT
BAG RETRIEVAL 12/15 (BASKET) IMPLANT
CHLORAPREP W/TINT 26 (MISCELLANEOUS) ×1 IMPLANT
CLIP LIGATING HEM O LOK PURPLE (MISCELLANEOUS) IMPLANT
CLIP LIGATING HEMO O LOK GREEN (MISCELLANEOUS) IMPLANT
COVER SURGICAL LIGHT HANDLE (MISCELLANEOUS) ×1 IMPLANT
COVER TIP SHEARS 8 DVNC (MISCELLANEOUS) ×1 IMPLANT
CUTTER ECHEON FLEX ENDO 45 340 (ENDOMECHANICALS) IMPLANT
DERMABOND ADVANCED .7 DNX12 (GAUZE/BANDAGES/DRESSINGS) ×2 IMPLANT
DRAIN CHANNEL RND F F (WOUND CARE) IMPLANT
DRAPE ARM DVNC X/XI (DISPOSABLE) ×4 IMPLANT
DRAPE COLUMN DVNC XI (DISPOSABLE) ×1 IMPLANT
DRAPE INCISE IOBAN 66X45 STRL (DRAPES) ×1 IMPLANT
DRAPE LAPAROSCOPIC ABDOMINAL (DRAPES) IMPLANT
DRAPE SHEET LG 3/4 BI-LAMINATE (DRAPES) ×1 IMPLANT
DRIVER NDL LRG 8 DVNC XI (INSTRUMENTS) ×2 IMPLANT
DRIVER NDLE LRG 8 DVNC XI (INSTRUMENTS) ×2 IMPLANT
DRSG TEGADERM 4X4.75 (GAUZE/BANDAGES/DRESSINGS) IMPLANT
ELECT PENCIL ROCKER SW 15FT (MISCELLANEOUS) IMPLANT
ELECT REM PT RETURN 15FT ADLT (MISCELLANEOUS) ×1 IMPLANT
FORCEPS BPLR LNG DVNC XI (INSTRUMENTS) ×1 IMPLANT
FORCEPS PROGRASP DVNC XI (FORCEP) ×1 IMPLANT
GAUZE SPONGE 2X2 8PLY STRL LF (GAUZE/BANDAGES/DRESSINGS) IMPLANT
GLOVE BIO SURGEON STRL SZ 6.5 (GLOVE) ×1 IMPLANT
GLOVE SURG LX STRL 7.5 STRW (GLOVE) ×2 IMPLANT
GOWN SRG XL LVL 4 BRTHBL STRL (GOWNS) ×1 IMPLANT
GOWN STRL REUS W/ TWL LRG LVL3 (GOWN DISPOSABLE) ×2 IMPLANT
HOLDER FOLEY CATH W/STRAP (MISCELLANEOUS) ×1 IMPLANT
IRRIG SUCT STRYKERFLOW 2 WTIP (MISCELLANEOUS) ×1 IMPLANT
IRRIGATION SUCT STRKRFLW 2 WTP (MISCELLANEOUS) IMPLANT
KIT BASIN OR (CUSTOM PROCEDURE TRAY) ×1 IMPLANT
KIT TURNOVER KIT A (KITS) IMPLANT
LOOP VESSEL MAXI BLUE (MISCELLANEOUS) IMPLANT
NS IRRIG 1000ML POUR BTL (IV SOLUTION) ×1 IMPLANT
PAD POSITIONING PINK XL (MISCELLANEOUS) ×1 IMPLANT
PROTECTOR NERVE ULNAR (MISCELLANEOUS) ×2 IMPLANT
RELOAD STAPLE 45 2.6 WHT THIN (STAPLE) IMPLANT
SCISSORS MNPLR CVD DVNC XI (INSTRUMENTS) ×1 IMPLANT
SEAL UNIV 5-12 XI (MISCELLANEOUS) ×3 IMPLANT
SET TUBE SMOKE EVAC HIGH FLOW (TUBING) ×1 IMPLANT
SOL ELECTROSURG ANTI STICK (MISCELLANEOUS) ×1 IMPLANT
SOLUTION ELECTROSURG ANTI STCK (MISCELLANEOUS) ×1 IMPLANT
SPIKE FLUID TRANSFER (MISCELLANEOUS) ×1 IMPLANT
STAPLE RELOAD 45 WHT (STAPLE) IMPLANT
SURGIFLO W/THROMBIN 8M KIT (HEMOSTASIS) IMPLANT
SUT MNCRL AB 4-0 PS2 18 (SUTURE) ×2 IMPLANT
SUT PDS AB 0 CTX 60 (SUTURE) ×1 IMPLANT
SUT V-LOC BARB 180 2/0GR6 GS22 (SUTURE) ×1 IMPLANT
SUT VIC AB 0 CT1 27XBRD ANTBC (SUTURE) ×1 IMPLANT
SUT VIC AB 2-0 SH 27X BRD (SUTURE) IMPLANT
SUT VICRYL 0 UR6 27IN ABS (SUTURE) IMPLANT
SUT VLOC BARB 180 ABS3/0GR12 (SUTURE) ×1 IMPLANT
SUTURE V-LC BRB 180 2/0GR6GS22 (SUTURE) IMPLANT
SUTURE VLOC BRB 180 ABS3/0GR12 (SUTURE) IMPLANT
SYS BAG RETRIEVAL 10MM (BASKET) ×1 IMPLANT
SYSTEM BAG RETRIEVAL 10MM (BASKET) IMPLANT
TOWEL OR 17X26 10 PK STRL BLUE (TOWEL DISPOSABLE) ×2 IMPLANT
TRAY FOLEY MTR SLVR 16FR STAT (SET/KITS/TRAYS/PACK) ×1 IMPLANT
TRAY LAPAROSCOPIC (CUSTOM PROCEDURE TRAY) ×1 IMPLANT
TROCAR ADV FIXATION 12X100MM (TROCAR) IMPLANT
TROCAR XCEL NON-BLD 5MMX100MML (ENDOMECHANICALS) IMPLANT
TROCAR Z THREAD OPTICAL 12X100 (TROCAR) IMPLANT
WATER STERILE IRR 1000ML POUR (IV SOLUTION) ×1 IMPLANT

## 2023-10-15 NOTE — H&P (Signed)
 53 year old [place] national presents today for evaluation of a renal cyst.   The patient states that he was in [place] last December and had a screening evaluation that involved a renal ultrasound. On the ultrasound he was told that he had a cyst and needed to follow-up with a doctor. He is here today for that follow-up. His images are in [place].   In addition, the patient has some questions regarding his voiding symptoms. He states that he gets up twice at night. He also states that he feels as if he is not emptying his bladder completely and has to strain at the end of his void. He has lots of urinary frequency in the mornings. He does drink up until bedtime. He also drinks lots of coffee and water in the mornings. Denies any hematuria or dysuria. Denies any incontinence.   Interval: Today the patient is here for reevaluation. I saw him 6 months ago, and at that point we obtained CT scan demonstrated a 1.6 cm enhancing renal mass in the right anterior lateral mid pole kidney. Today the patient had a repeat CT scan. He denies any other symptoms.   The patient has significant past surgical history. He has past medical history significant for hypertension.     ALLERGIES: No Allergies    MEDICATIONS: Amlodipine Besylate 5 mg tablet  Olmesartan Medoxomil 20 mg tablet     GU PSH: No GU PSH    NON-GU PSH: No Non-GU PSH    GU PMH: Renal cyst - 07/28/2023, - 02/13/2023, - 01/26/2023 Encounter for Prostate Cancer screening - 01/26/2023 Nocturia - 01/26/2023    NON-GU PMH: Hypertension    FAMILY HISTORY: 1 son - Son Diabetes - Mother   SOCIAL HISTORY: Marital Status: Married Preferred Language: English; Ethnicity: Not Hispanic Or Latino Current Smoking Status: Patient has never smoked.   Tobacco Use Assessment Completed: Used Tobacco in last 30 days? Drinks 1 drink per day.  Drinks 2 caffeinated drinks per day. Patient's occupation Product/process development scientist.    REVIEW OF SYSTEMS:    GU Review Male:    Patient denies frequent urination, hard to postpone urination, burning/ pain with urination, get up at night to urinate, leakage of urine, stream starts and stops, trouble starting your stream, have to strain to urinate , erection problems, and penile pain.  Gastrointestinal (Upper):   Patient denies nausea, vomiting, and indigestion/ heartburn.  Gastrointestinal (Lower):   Patient denies diarrhea and constipation.  Constitutional:   Patient denies fever, night sweats, weight loss, and fatigue.  Skin:   Patient denies skin rash/ lesion and itching.  Eyes:   Patient denies blurred vision and double vision.  Ears/ Nose/ Throat:   Patient denies sore throat and sinus problems.  Hematologic/Lymphatic:   Patient denies swollen glands and easy bruising.  Cardiovascular:   Patient denies leg swelling and chest pains.  Respiratory:   Patient denies cough and shortness of breath.  Endocrine:   Patient denies excessive thirst.  Musculoskeletal:   Patient denies back pain and joint pain.  Neurological:   Patient denies headaches and dizziness.  Psychologic:   Patient denies depression and anxiety.   VITAL SIGNS: None   Complexity of Data:  Source Of History:  Patient  Lab Test Review:   BMP  Records Review:   Previous Doctor Records, Previous Patient Records  Urine Test Review:   Urinalysis  X-Ray Review: C.T. Abdomen/Pelvis: Reviewed Films. Discussed With Patient.     01/26/23  PSA  Total PSA 1.11  ng/mL    PROCEDURES:          Visit Complexity - G2211          Urinalysis - 81003 Dipstick Dipstick Cont'd  Color: Yellow Bilirubin: Neg  Appearance: Clear Ketones: Neg  Specific Gravity: 1.015 Blood: Neg  pH: 5.5 Protein: Trace  Glucose: Neg Urobilinogen: 0.2    Nitrites: Neg    Leukocyte Esterase: Neg    Notes:      ASSESSMENT:      ICD-10 Details  1 GU:   Right renal neoplasm - D49.511      PLAN:           Document Letter(s):  Created for Patient: Clinical Summary          Notes:   The patient has been given the natural history of renal cancer, treatment options, and recommended surgical extirpation for this patient. I went over the robotic-assisted laparoscopic partial nephrectomy approach. I described for the patient the procedure in detail including port placement. I detailed the postoperative course including the fact that the patient would have both a drain and a Foley catheter following the surgery. I told the patient that most often patients are discharged on postoperative day one or 2. I then detailed the expected recovery time, I told the patient that he would not be able to lift anything greater than 20 pounds for 4 weeks. I also went over the risks and benefits of this operation in great detail. We discussed the risk of injury to surrounding structures, major blood vessels and nerves, bleeding, infection, loss of kidney, and the risk of recurrent cancer.   The patient has a small right renal mass concerning for malignancy. Will try to get him scheduled for surgery at his convenience.

## 2023-10-15 NOTE — Progress Notes (Signed)
 Post-op note  Subjective: The patient is doing well.  No complaints.  Objective: Vital signs in last 24 hours: Temp:  [97.6 F (36.4 C)-98 F (36.7 C)] 98 F (36.7 C) (03/27 1305) Pulse Rate:  [73-100] 74 (03/27 1305) Resp:  [9-21] 20 (03/27 1305) BP: (114-134)/(72-83) 114/74 (03/27 1305) SpO2:  [94 %-100 %] 96 % (03/27 1305) Weight:  [72.6 kg] 72.6 kg (03/27 0644)  Intake/Output from previous day: No intake/output data recorded. Intake/Output this shift: Total I/O In: 1700 [I.V.:1600; IV Piggyback:100] Out: 650 [Urine:550; Blood:100]  Physical Exam:  General: Alert and oriented. Abdomen: Soft, Nondistended. Incisions: Clean and dry.  Lab Results: Recent Labs    10/15/23 1150  HGB 12.7*  HCT 38.5*    Assessment/Plan: POD#0   1) Continue to monitor  Roby Lofts, MD Resident Physician [facility]   LOS: 0 days   Zettie Pho 10/15/2023, 3:22 PM

## 2023-10-15 NOTE — Transfer of Care (Signed)
 Immediate Anesthesia Transfer of Care Note  Patient: Robert Richardson  Procedure(s) Performed: XI ROBOTIC ASSISTED  PARTIAL LAPAROSCOPIC RIGHT NEPHRECTOMY (Right)  Patient Location: PACU  Anesthesia Type:General  Level of Consciousness: awake, alert , and oriented  Airway & Oxygen Therapy: Patient Spontanous Breathing and Patient connected to face mask oxygen  Post-op Assessment: Report given to RN and Post -op Vital signs reviewed and stable  Post vital signs: Reviewed and stable  Last Vitals:  Vitals Value Taken Time  BP    Temp    Pulse    Resp 21 10/15/23 1118  SpO2    Vitals shown include unfiled device data.  Last Pain:  Vitals:   10/15/23 0644  TempSrc:   PainSc: 0-No pain         Complications: No notable events documented.

## 2023-10-15 NOTE — Discharge Instructions (Signed)

## 2023-10-15 NOTE — Op Note (Signed)
 Preoperative diagnosis:  right renal mass   Postoperative diagnosis:  same   Procedure: Robotic assisted laparoscopic right partial nephrectomy Intraoperative ultrasound of retroperitoneal organ  Surgeon: Crist Fat, MD 1st assistant: Lujean Rave, PA Resident Assistant: Melvenia Beam, MD  An assistant was required for this surgical procedure.  The duties of the assistant included but were not limited to suctioning, passing suture, camera manipulation, retraction. This procedure would not be able to be performed without an Geophysicist/field seismologist.   Anesthesia: General  Complications: None  Intraoperative findings:  #1. Three veins, single artery with early branch #2. Warm ischemia time 8 min #3.  Margins grossly negative  EBL: 100 mL  Specimens: right renal mass  Indication:  Robert Richardson is a 53 y.o. patient with right renal mass.  After reviewing the management options for treatment, he elected to proceed with the above surgical procedure(s). We have discussed the potential benefits and risks of the procedure, side effects of the proposed treatment, the likelihood of the patient achieving the goals of the procedure, and any potential problems that might occur during the procedure or recuperation. Informed consent has been obtained.  Description of procedure:  An assistant was required for this surgical procedure.  The duties of the assistant included but were not limited to suctioning, passing suture, camera manipulation, retraction. This procedure would not be able to be performed without an Geophysicist/field seismologist.  The patient was taken to the operating room and a general anesthetic was administered. The patient was given preoperative antibiotics, placed in the right modified flank position with care to pad all potential pressure points, and prepped and draped in the usual sterile fashion. Next a preoperative timeout was performed.  A site was selected on the right side of the umbilicus for  placement of the camera port. This was placed using a standard modified Hassan technique with entry into the peritoneum with a 10 mm 0 deg laparoscope with a visual obturator. We entered the peritoneum without incident and established pneumoperitoneum.  The camera was then used to inspect the abdomen and there was no evidence of any intra-abdominal injuries or other abnormalities. The remaining abdominal ports were then placed. 8 mm robotic ports were placed in the right upper quadrant, right lower quadrant, and far right lateral abdominal wall. A 12 mm port was placed in the upper midline for laparoscopic assistance. Lysis of adhesions were performed at the midline to allow placement of the 12 mm assistant port. All ports were placed under direct vision without difficulty. The surgical cart was then docked.   Utilizing the cautery scissors, the white line of Toldt was incised allowing the colon to be mobilized medially and the plane between the mesocolon and the anterior layer of Gerota's fascia to be developed and the kidney to be exposed.  The ureter and gonadal vein were identified inferiorly and the ureter was lifted anteriorly off the psoas muscle.  Dissection proceeded superiorly along the gonadal vein until the renal vein was identified.  The renal hilum was then carefully isolated with a combination of blunt and sharp dissection allowing the renal arterial and venous structures to be separated and isolated in preparation for renal hilar vessel clamping.  Attention turned to the kidney and the perinephric fat surrounding the renal mass was removed and the kidney was mobilized sufficiently for exposure and resection of the renal mass.  The margins were then demarcated using intraoperative ultrasound.  Once the renal mass was properly isolated, preparations were made for resection of  the tumor.    The renal artery was then clamped with a bulldog clamp.  The tumor was then excised with cold scissor  dissection along with an adequate visible gross margin of normal renal parenchyma. The tumor appeared to be excised without any gross violation of the tumor. The renal collecting system was not entered during removal of the tumor.  A running 3-0 V-lock suture was then brought through the capsule of the kidney and run along the base of the renal defect to provide hemostasis and close any entry into the renal collecting system if present. Weck clips were used to secure this suture outside the renal capsule at the proximal and distal ends. The bulldog clamps were then removed from the renal hilar vessel. A running 2-0 V lock suture was then used to close the capsule of the kidney using a sliding clip technique which resulted in excellent hemostasis. An additional hemostatic agent (Surgiflo) was then placed into the renal defect. Surgicel was then placed over the defect.    Total warm renal ischemia time was  8 minutes. The renal tumor resection site was examined. Hemostasis appeared adequate.   The kidney was placed back into its normal anatomic position and covered with perinephric fat as needed.  It was secured to the skin with a nylon suture. The surgical robotic cart was undocked.  The renal tumor specimen was removed intact within an endopouch retrieval bag via the camera port sites.  The camera port site and the other 12 mm port site were then closed at the fascial level with 0-vicryl suture.  All other laparoscopic/robotic ports were removed under direct vision and the pneumoperitoneum let down with inspection of the operative field performed and hemostasis again confirmed. All incision sites were then injected with local anesthetic and reapproximated at the skin level with 4-0 monocryl subcuticular closures. Dermabond was applied to the skin.  The patient tolerated the procedure well and without complications.  The patient was able to be extubated and transferred to the recovery unit in satisfactory  condition.  Crist Fat, M.D.

## 2023-10-15 NOTE — Interval H&P Note (Signed)
 History and Physical Interval Note:  10/15/2023 7:29 AM  Robert Richardson  has presented today for surgery, with the diagnosis of RIGHT RENAL MASS.  The various methods of treatment have been discussed with the patient and family. After consideration of risks, benefits and other options for treatment, the patient has consented to  Procedure(s) with comments: XI ROBOTIC ASSISTED  PARTIAL LAPAROSCOPIC RIGHT NEPHRECTOMY (Right) - 210 MINUTE CASE as a surgical intervention.  The patient's history has been reviewed, patient examined, no change in status, stable for surgery.  I have reviewed the patient's chart and labs.  Questions were answered to the patient's satisfaction.     Crist Fat

## 2023-10-15 NOTE — Anesthesia Procedure Notes (Signed)
 Procedure Name: Intubation Date/Time: 10/15/2023 7:58 AM  Performed by: Vanessa Hammond, CRNAPre-anesthesia Checklist: Patient identified, Emergency Drugs available, Suction available and Patient being monitored Patient Re-evaluated:Patient Re-evaluated prior to induction Oxygen Delivery Method: Circle system utilized Preoxygenation: Pre-oxygenation with 100% oxygen Induction Type: IV induction Ventilation: Mask ventilation without difficulty Laryngoscope Size: Mac and 3 Grade View: Grade I Tube type: Oral Tube size: 7.5 mm Number of attempts: 1 Airway Equipment and Method: Stylet Placement Confirmation: ETT inserted through vocal cords under direct vision, positive ETCO2 and breath sounds checked- equal and bilateral Secured at: 22 cm Tube secured with: Tape Dental Injury: Teeth and Oropharynx as per pre-operative assessment  Comments: 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

## 2023-10-15 NOTE — Anesthesia Preprocedure Evaluation (Signed)
 Anesthesia Evaluation  Patient identified by MRN, date of birth, ID band Patient awake    Reviewed: Allergy & Precautions, H&P , NPO status , Patient's Chart, lab work & pertinent test results  Airway Mallampati: II   Neck ROM: full    Dental   Pulmonary neg pulmonary ROS   breath sounds clear to auscultation       Cardiovascular hypertension,  Rhythm:regular Rate:Normal     Neuro/Psych    GI/Hepatic   Endo/Other    Renal/GU Right renal mass     Musculoskeletal   Abdominal   Peds  Hematology   Anesthesia Other Findings   Reproductive/Obstetrics                             Anesthesia Physical Anesthesia Plan  ASA: 2  Anesthesia Plan: General   Post-op Pain Management:    Induction: Intravenous  PONV Risk Score and Plan: 2 and Ondansetron, Dexamethasone, Midazolam and Treatment may vary due to age or medical condition  Airway Management Planned: Oral ETT  Additional Equipment:   Intra-op Plan:   Post-operative Plan: Extubation in OR  Informed Consent: I have reviewed the patients History and Physical, chart, labs and discussed the procedure including the risks, benefits and alternatives for the proposed anesthesia with the patient or authorized representative who has indicated his/her understanding and acceptance.     Dental advisory given  Plan Discussed with: CRNA, Anesthesiologist and Surgeon  Anesthesia Plan Comments:        Anesthesia Quick Evaluation

## 2023-10-16 ENCOUNTER — Encounter (HOSPITAL_COMMUNITY): Payer: Self-pay | Admitting: Urology

## 2023-10-16 DIAGNOSIS — N2889 Other specified disorders of kidney and ureter: Secondary | ICD-10-CM | POA: Diagnosis not present

## 2023-10-16 LAB — HEMOGLOBIN AND HEMATOCRIT, BLOOD
HCT: 33.8 % — ABNORMAL LOW (ref 39.0–52.0)
Hemoglobin: 11.4 g/dL — ABNORMAL LOW (ref 13.0–17.0)

## 2023-10-16 LAB — BASIC METABOLIC PANEL WITH GFR
Anion gap: 8 (ref 5–15)
BUN: 16 mg/dL (ref 6–20)
CO2: 22 mmol/L (ref 22–32)
Calcium: 9.1 mg/dL (ref 8.9–10.3)
Chloride: 109 mmol/L (ref 98–111)
Creatinine, Ser: 0.99 mg/dL (ref 0.61–1.24)
GFR, Estimated: >60 mL/min (ref >60)
Glucose, Bld: 98 mg/dL (ref 70–99)
Potassium: 4.3 mmol/L (ref 3.5–5.1)
Sodium: 139 mmol/L (ref 135–145)

## 2023-10-16 LAB — SURGICAL PATHOLOGY

## 2023-10-16 NOTE — Discharge Summary (Signed)
 Date of admission: 10/15/2023  Date of discharge: 10/16/2023  Admission diagnosis:  Renal mass, right [N28.89]   Discharge diagnosis:  Renal mass, right [N28.89]  Secondary diagnoses:   Active Ambulatory Problems    Diagnosis Date Noted   No Active Ambulatory Problems   Resolved Ambulatory Problems    Diagnosis Date Noted   No Resolved Ambulatory Problems   Past Medical History:  Diagnosis Date   Hypertension      History and Physical: For full details, please see admission history and physical. Briefly, Robert Richardson is a 53 y.o. year old patient who was admitted with right renal mass.   Hospital Course: Pt admitted and underwent robotic assisted right partial nephrectomy on 10/15/2023. Their hospital course was unremarkable. By POD1, they were tolerating a regular diet, voiding spontaneously, pain was controlled with oral medications, and they were deemed appropriate for discharge.   Their course was complicated by: None  On the day of discharge, the patient was tolerating a regular diet and their pain was well controlled. They were determined to be stable for discharge home  Laboratory values:  Recent Labs    10/15/23 1150 10/16/23 0440  HGB 12.7* 11.4*  HCT 38.5* 33.8*   Recent Labs    10/15/23 1150  CREATININE 0.91    Disposition: Home  Discharge medications:  Allergies as of 10/16/2023   No Known Allergies      Medication List     STOP taking these medications    b complex vitamins capsule   CALCIUM 600 + D PO   Move Free Joint Health Advance Tabs   multivitamin with minerals Tabs tablet       TAKE these medications    amLODipine 5 MG tablet Commonly known as: NORVASC Take 5 mg by mouth daily.   docusate sodium 100 MG capsule Commonly known as: COLACE Take 1 capsule (100 mg total) by mouth 2 (two) times daily.   olmesartan 20 MG tablet Commonly known as: BENICAR Take 20 mg by mouth daily.   traMADol 50 MG tablet Commonly known as:  Ultram Take 1-2 tablets (50-100 mg total) by mouth every 6 (six) hours as needed for moderate pain (pain score 4-6) or severe pain (pain score 7-10).        Followup:   Follow-up Information     Hillery Aldo, NP Follow up on 10/29/2023.   Specialty: Nurse Practitioner Why: at 1:30 Contact information: [address] [place] [address] [place] [address] [phone]

## 2023-10-16 NOTE — Progress Notes (Signed)
   10/16/23 0846  TOC Brief Assessment  Insurance and Status Reviewed  Patient has primary care physician Yes  Home environment has been reviewed Resides in single family home with spouse  Prior level of function: Independent with ADLs at baseline  Prior/Current Home Services No current home services  Social Drivers of Health Review SDOH reviewed no interventions necessary  Readmission risk has been reviewed Yes  Transition of care needs no transition of care needs at this time

## 2023-10-16 NOTE — Anesthesia Postprocedure Evaluation (Signed)
 Anesthesia Post Note  Patient: Robert Richardson  Procedure(s) Performed: XI ROBOTIC ASSISTED  PARTIAL LAPAROSCOPIC RIGHT NEPHRECTOMY (Right)     Patient location during evaluation: PACU Anesthesia Type: General Level of consciousness: awake and alert Pain management: pain level controlled Vital Signs Assessment: post-procedure vital signs reviewed and stable Respiratory status: spontaneous breathing, nonlabored ventilation, respiratory function stable and patient connected to nasal cannula oxygen Cardiovascular status: blood pressure returned to baseline and stable Postop Assessment: no apparent nausea or vomiting Anesthetic complications: no   No notable events documented.  Last Vitals:  Vitals:   10/16/23 0124 10/16/23 0503  BP: 103/69 94/66  Pulse: 71 61  Resp: 18 20  Temp: 36.9 C 36.5 C  SpO2: 96% 97%    Last Pain:  Vitals:   10/16/23 0503  TempSrc: Oral  PainSc:                  [place] S

## 2024-05-06 ENCOUNTER — Other Ambulatory Visit (HOSPITAL_COMMUNITY): Payer: Self-pay | Admitting: Nurse Practitioner

## 2024-05-06 ENCOUNTER — Ambulatory Visit (HOSPITAL_COMMUNITY)
Admission: RE | Admit: 2024-05-06 | Discharge: 2024-05-06 | Disposition: A | Source: Ambulatory Visit | Attending: Nurse Practitioner

## 2024-05-06 DIAGNOSIS — C641 Malignant neoplasm of right kidney, except renal pelvis: Secondary | ICD-10-CM

## 2024-07-30 ENCOUNTER — Emergency Department (HOSPITAL_COMMUNITY)
Admission: EM | Admit: 2024-07-30 | Discharge: 2024-07-30 | Disposition: A | Discharge status: Home / Self Care | Attending: Emergency Medicine | Admitting: Emergency Medicine

## 2024-07-30 ENCOUNTER — Emergency Department (HOSPITAL_COMMUNITY)

## 2024-07-30 ENCOUNTER — Encounter (HOSPITAL_COMMUNITY): Payer: Self-pay

## 2024-07-30 DIAGNOSIS — I10 Essential (primary) hypertension: Secondary | ICD-10-CM | POA: Diagnosis not present

## 2024-07-30 DIAGNOSIS — R55 Syncope and collapse: Secondary | ICD-10-CM | POA: Diagnosis present

## 2024-07-30 LAB — COMPREHENSIVE METABOLIC PANEL WITH GFR
ALT: 23 U/L (ref 0–44)
AST: 23 U/L (ref 15–41)
Albumin: 4.4 g/dL (ref 3.5–5.0)
Alkaline Phosphatase: 63 U/L (ref 38–126)
Anion gap: 10 (ref 5–15)
BUN: 19 mg/dL (ref 6–20)
CO2: 25 mmol/L (ref 22–32)
Calcium: 9.1 mg/dL (ref 8.9–10.3)
Chloride: 102 mmol/L (ref 98–111)
Creatinine, Ser: 0.84 mg/dL (ref 0.61–1.24)
GFR, Estimated: >60 mL/min
Glucose, Bld: 113 mg/dL — ABNORMAL HIGH (ref 70–99)
Potassium: 4.1 mmol/L (ref 3.5–5.1)
Sodium: 138 mmol/L (ref 135–145)
Total Bilirubin: 0.6 mg/dL (ref 0.0–1.2)
Total Protein: 7.2 g/dL (ref 6.5–8.1)

## 2024-07-30 LAB — CBC WITH DIFFERENTIAL/PLATELET
Abs Immature Granulocytes: 0.03 K/uL (ref 0.00–0.07)
Basophils Absolute: 0 K/uL (ref 0.0–0.1)
Basophils Relative: 0 %
Eosinophils Absolute: 0 K/uL (ref 0.0–0.5)
Eosinophils Relative: 0 %
HCT: 37.1 % — ABNORMAL LOW (ref 39.0–52.0)
Hemoglobin: 13 g/dL (ref 13.0–17.0)
Immature Granulocytes: 1 %
Lymphocytes Relative: 12 %
Lymphs Abs: 0.7 K/uL (ref 0.7–4.0)
MCH: 31.8 pg (ref 26.0–34.0)
MCHC: 35 g/dL (ref 30.0–36.0)
MCV: 90.7 fL (ref 80.0–100.0)
Monocytes Absolute: 0.2 K/uL (ref 0.1–1.0)
Monocytes Relative: 4 %
Neutro Abs: 4.8 K/uL (ref 1.7–7.7)
Neutrophils Relative %: 83 %
Platelets: 200 K/uL (ref 150–400)
RBC: 4.09 MIL/uL — ABNORMAL LOW (ref 4.22–5.81)
RDW: 11.5 % (ref 11.5–15.5)
WBC: 5.8 K/uL (ref 4.0–10.5)
nRBC: 0 % (ref 0.0–0.2)

## 2024-07-30 LAB — TROPONIN T, HIGH SENSITIVITY
Troponin T High Sensitivity: <15 ng/L (ref 0–19)
Troponin T High Sensitivity: <15 ng/L (ref 0–19)

## 2024-07-30 LAB — MAGNESIUM: Magnesium: 2.3 mg/dL (ref 1.7–2.4)

## 2024-07-30 LAB — PRO BRAIN NATRIURETIC PEPTIDE: Pro Brain Natriuretic Peptide: <50 pg/mL

## 2024-07-30 LAB — D-DIMER, QUANTITATIVE: D-Dimer, Quant: <0.27 ug{FEU}/mL (ref 0.00–0.50)

## 2024-07-30 MED ORDER — LORAZEPAM 2 MG/ML IJ SOLN
0.5000 mg | Freq: Once | INTRAMUSCULAR | Status: AC
  Administered 2024-07-30: 0.5 mg via INTRAVENOUS
  Filled 2024-07-30: qty 1

## 2024-07-30 MED ORDER — IOHEXOL 350 MG/ML SOLN
75.0000 mL | Freq: Once | INTRAVENOUS | Status: AC | PRN
  Administered 2024-07-30: 75 mL via INTRAVENOUS

## 2024-07-30 MED ORDER — ACETAMINOPHEN 500 MG PO TABS
1000.0000 mg | ORAL_TABLET | Freq: Once | ORAL | Status: AC
  Administered 2024-07-30: 1000 mg via ORAL
  Filled 2024-07-30: qty 2

## 2024-07-30 NOTE — ED Notes (Signed)
 Help get patient into a gown on the monitor did EKG patient is resting with call bell in reach

## 2024-07-30 NOTE — Discharge Instructions (Signed)
 You were seen today for dizziness. While you were here we monitored your vitals, preformed a physical exam, and labs, imaging, and consultation from specialist. These were all reassuring and there is no indication for any further testing or intervention in the emergency department at this time.   Things to do:  - Follow up with your primary care provider within the next 1-2 weeks - Neurosurgery is planning to see you next week in clinic, please call [phone] if they do not call in the next several days for an appointment - It is imperative we keep your blood pressure down.  If you have any blood pressures greater than 140, please call your PCP.  Return to the emergency department if he unable to get a hold of them.  Return to the emergency department if you have any new or worsening symptoms including worsening headaches, vision changes, difficulty breathing, numbness tingling extremities, persistently high blood pressure, or if you have any other concerns.

## 2024-07-30 NOTE — ED Triage Notes (Signed)
 BIB EMS from home r/t sudden chest pain that started 2pm today. Reports dizziness with movement. Endorsing HA x3days. EMS administered 324 ASA en route. Chest pain has resolved. A&Ox4.

## 2024-07-30 NOTE — ED Provider Notes (Signed)
 I saw and evaluated the patient, reviewed the resident's note and I agree with the findings and plan.  EKG Interpretation Date/Time:  Saturday July 30 2024 18:22:08 EST Ventricular Rate:  86 PR Interval:  149 QRS Duration:  76 QT Interval:  345 QTC Calculation: 413 R Axis:   63  Text Interpretation: Sinus rhythm No significant change since last tracing Confirmed by Dasie Faden ([id number]) on 07/30/2024 6:50:64 PM   54 year old male presents with dizziness that is worse with standing.  He denied any anginal symptoms.  Is also worse with exertion.  Has had a mild headache at times.  Denies any focal weakness.  On exam here, patient has no focal neurological deficits.  Labs are significant for negative troponin as well as D-dimer.  Low suspicion for ACS or PE.  Will CT angio head and neck and reassess   Dasie Faden, MD 07/30/24 2014

## 2024-07-30 NOTE — ED Provider Notes (Signed)
 " [facility] Provider Note   HPI/ROS    History obtained from patient.  Robert Richardson is a 54 y.o. male who presents for Chest Pain and Headache and who  has a past medical history of Hypertension.  Patient presents today for 3 to 4 days of ongoing lightheadedness at home. States when walking long distances or laying down acutely he feels blood rushing to his head and feels like he may pass out.  Has intermittently had some chest pain and nausea as well.  Endorses a bitemporal headache without visual changes that is largely resolved.  He states that whenever he lays down he feels other blood rushing to his head which then causes him to sit back up because he feels nauseous.  Denies any feeling like the room is spinning.  Denies any fevers, chills, cough, congestion, abdominal pain, or diarrhea.  Denies any difficulty with ambulation or numbness or tingling in the extremities.  MDM   I have reviewed the nursing documentation, vital signs, as well as the past medical history, surgical history, family history, and social history.  Initial Assessment:  Patient hemodynamically stable on initial evaluation.  Story seems to be consistent with presyncope.  No infectious symptoms.  No focal neurologic deficit on physical exam.  Denies any chest pain or shortness of breath now.  Will obtain broad syncope workup including CTA head and neck, troponin, BNP, D-dimer, and other basic labs.  Do not feel this is consistent with CVA/patient is having odd neurologic complaints.  Will also obtain chest x-ray and EKG. Most of his symptoms seem to be surrounding fluctuations in his BP. Could also be 2/2 to anxiety. Symmetric pulses and no chest pain tearing to the back consistent with AAS. No tachycardia, tachypnea or hypoxia consistent with PE. Cannot use PERC given patients age, but low risk for PE per [place] criteria. Could be dysrhythmia.  But feeling the blood rush up into his  head when this lightheaded/dizziness occurs is odd. No visual difficulties or field cuts.   CBC no significant cytosis, anemia, or thrombocytopenia.  D-dimer negative, so very low concern for PE.  BNP negative and troponin negative as well.  Lower concern for cardiac syncope/presyncope.  Electrolytes within normal limits with no significant electrolyte abnormality, or transaminitis.  Chest x-ray with no focal airspace disease, subdiaphragmatic air, or pneumothorax.  No widened mediastinum.  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  No field cut or hormonal symptoms consistent with symptomatic pituitary adenoma.  Clear whether AVM is the cause of the blood rushing feeling in his head.  Will touch base with neurosurgery.  Follow-up with Dr.Janjua from neurosurgery.  Does not feel this is likely caused by his AVM or mass.  Feels this is more likely due to blood pressure, I am in agreement given patient's symptoms are positional, associated with exertion.  Neurosurgery plans to follow-up with the patient in clinic this week.  Had further discussion with patient regarding his symptoms.  Currently asymptomatic at this time and does not feel like he is going to pass out.  Discussed with patient that these changes could be related to his blood pressure from the supplements causing symptoms at this time.  Instructed him to adhere strictly to his blood pressure medication regimen at home and talk with his PCP on Monday about possibly increasing his blood pressure meds.  Also discussed immediate return to the emergency department for returning symptoms or worsening symptoms.  He is given this plan at this time and plans to follow-up with neurosurgery next week, his PCP, emergency department for any further concerns.  Patient discharged home stable condition to return precautions.  Disposition:  I discussed the plan for discharge with the patient and/or their surrogate at bedside prior to discharge and they  were in agreement with the plan and verbalized understanding of the return precautions provided. All questions answered to the best of my ability. Ultimately, the patient was discharged in stable condition with stable vital signs. I am reassured that they are capable of close follow up and good social support at home.     This patient was staffed with Dr. Dasie who supervised the visit and agreed with the plan of care.   Due to the patients current presenting symptoms, physical exam findings, and the workup stated above, it is thought that the etiology of the patients current presentation is:  1. Pre-syncope    Clinical Complexity A medically appropriate history, review of systems, and physical exam was performed.  Factors that affect the complexity of this encounter: assessment of correct protocol, laboratory work from this visit, and review of echocardiogram/EKG results  My independent interpretations of diagnostic studies are documented in the ED course above.   If decision rules were used in this patient's evaluation, they are listed below.   Click here for ABCD2, HEART and other calculators  Patient's presentation is most consistent with acute complicated illness / injury requiring diagnostic workup.  MDM generated using voice dictation software and may contain dictation errors. Please contact me for any clarification or with any questions.    Physical Exam, PMH, PSH, Family History, and Social Hsitory   Vitals:   07/30/24 2130 07/30/24 2200 07/30/24 2230 07/30/24 2235  BP: 113/82 113/81 119/83   Pulse: 63 61 63   Resp: 14 20 15    Temp:    98.3 F (36.8 C)  TempSrc:    Oral  SpO2: 97% 100% 100%   Weight:      Height:        Physical Exam Constitutional:      Appearance: He is well-developed.  HENT:     Head: Normocephalic and atraumatic.  Eyes:     Extraocular Movements: Extraocular movements intact.     Pupils: Pupils are equal, round, and reactive to light.   Cardiovascular:     Rate and Rhythm: Normal rate and regular rhythm.     Pulses:          Radial pulses are 2+ on the right side and 2+ on the left side.     Heart sounds: Normal heart sounds.  Pulmonary:     Effort: Pulmonary effort is normal.     Breath sounds: No wheezing, rhonchi or rales.  Abdominal:     Palpations: Abdomen is soft.     Tenderness: There is no abdominal tenderness. There is no guarding or rebound.  Musculoskeletal:        General: Normal range of motion.     Right lower leg: No edema.     Left lower leg: No edema.  Skin:    General: Skin is warm and dry.     Capillary Refill: Capillary refill takes less than 2 seconds.  Neurological:     General: No focal deficit present.     Mental Status: He is alert and oriented to person, place, and time.     Past Medical History:  Diagnosis Date   Hypertension  Past Surgical History:  Procedure Laterality Date   ROBOT ASSISTED LAPAROSCOPIC NEPHRECTOMY Right 10/15/2023   Procedure: XI ROBOTIC ASSISTED  PARTIAL LAPAROSCOPIC RIGHT NEPHRECTOMY;  Surgeon: Cam Morene ORN, MD;  Location: [facility];  Service: Urology;  Laterality: Right;  210 MINUTE CASE     History reviewed. No pertinent family history.  Social History   Tobacco Use   Smoking status: Never   Smokeless tobacco: Never  Substance Use Topics   Alcohol use: Yes    Comment: occas     Procedures   If procedures were preformed on this patient, they are listed below:  Procedures   Electronically signed by:   Glendia Carlin Ancona, M.D. PGY-2, Emergency Medicine   Please note that this documentation was produced with the assistance of voice-to-text technology and may contain errors.    Ancona Glendia, MD 07/31/24 1409    Dasie Faden, MD 07/31/24 1545  "

## 2024-08-01 ENCOUNTER — Emergency Department (HOSPITAL_COMMUNITY)
Admission: EM | Admit: 2024-08-01 | Discharge: 2024-08-01 | Discharge status: Against Medical Advice | Attending: Emergency Medicine | Admitting: Emergency Medicine

## 2024-08-01 ENCOUNTER — Encounter (HOSPITAL_COMMUNITY): Payer: Self-pay

## 2024-08-01 ENCOUNTER — Emergency Department (HOSPITAL_COMMUNITY)

## 2024-08-01 DIAGNOSIS — Z5321 Procedure and treatment not carried out due to patient leaving prior to being seen by health care provider: Secondary | ICD-10-CM | POA: Insufficient documentation

## 2024-08-01 DIAGNOSIS — I10 Essential (primary) hypertension: Secondary | ICD-10-CM | POA: Diagnosis not present

## 2024-08-01 DIAGNOSIS — R519 Headache, unspecified: Secondary | ICD-10-CM | POA: Diagnosis present

## 2024-08-01 LAB — CBC
HCT: 40.1 % (ref 39.0–52.0)
Hemoglobin: 13.8 g/dL (ref 13.0–17.0)
MCH: 32.2 pg (ref 26.0–34.0)
MCHC: 34.4 g/dL (ref 30.0–36.0)
MCV: 93.5 fL (ref 80.0–100.0)
Platelets: 231 K/uL (ref 150–400)
RBC: 4.29 MIL/uL (ref 4.22–5.81)
RDW: 11.8 % (ref 11.5–15.5)
WBC: 6.8 K/uL (ref 4.0–10.5)
nRBC: 0 % (ref 0.0–0.2)

## 2024-08-01 LAB — BASIC METABOLIC PANEL WITH GFR
Anion gap: 11 (ref 5–15)
BUN: 16 mg/dL (ref 6–20)
CO2: 24 mmol/L (ref 22–32)
Calcium: 9.2 mg/dL (ref 8.9–10.3)
Chloride: 105 mmol/L (ref 98–111)
Creatinine, Ser: 0.78 mg/dL (ref 0.61–1.24)
GFR, Estimated: >60 mL/min
Glucose, Bld: 94 mg/dL (ref 70–99)
Potassium: 4.3 mmol/L (ref 3.5–5.1)
Sodium: 140 mmol/L (ref 135–145)

## 2024-08-01 LAB — TROPONIN T, HIGH SENSITIVITY: Troponin T High Sensitivity: <15 ng/L (ref 0–19)

## 2024-08-01 NOTE — ED Triage Notes (Signed)
 Pt came in via POV d/t HTN & feeling dizziness that he noticed this morning after he went to the bathroom wen he woke up. Reports taking his BP meds & then his BP was still going up. Also has a HA & feeling some dizziness when arrived to triage.

## 2024-08-01 NOTE — ED Provider Triage Note (Signed)
 Emergency Medicine Provider Triage Evaluation Note  Koray Soter , a 54 y.o. male  was evaluated in triage.  Pt complains of similar symptoms from when he was seen here a few days ago.  Reports that he woke up to walk to his bathroom and took his blood pressure medicine and his blood pressure was in the 130s.  He reports around 40 to 50 minutes later he started to feel dizzy again and could feel the blood rushing to his head from his chest and feel that both of his arms were tingly.  Reports that he then felt short of breath but denies that he was feeling anxious.  Reports that he was having some chest pain.  Reports that now he just has a little bit of a headache but otherwise feels at his baseline..  Review of Systems  Positive: Negative:   Physical Exam  BP 123/76   Pulse 82   Temp 98 F (36.7 C)   Resp 16   Ht 5' 7 (1.702 m)   Wt 74.4 kg   SpO2 95%   BMI 25.69 kg/m  Gen:   Awake, no distress   Resp:  Normal effort  MSK:   Moves extremities without difficulty  Other:  Moving all extremities  Medical Decision Making  Medically screening exam initiated at 12:31 PM.  Appropriate orders placed.  Raijon Lindfors was informed that the remainder of the evaluation will be completed by another provider, this initial triage assessment does not replace that evaluation, and the importance of remaining in the ED until their evaluation is complete.  The patient was seen here and had a full workup including NS consultation. Will order cardaic workup and allow for further management in the main ER   Bernis Ernst, PA-C 08/01/24 1233

## 2024-08-01 NOTE — ED Notes (Signed)
Pt called x3 no response.  

## 2024-08-02 ENCOUNTER — Other Ambulatory Visit: Payer: Self-pay

## 2024-08-02 ENCOUNTER — Ambulatory Visit (INDEPENDENT_AMBULATORY_CARE_PROVIDER_SITE_OTHER): Admitting: Neurosurgery

## 2024-08-02 ENCOUNTER — Encounter: Payer: Self-pay | Admitting: Neurosurgery

## 2024-08-02 ENCOUNTER — Other Ambulatory Visit (HOSPITAL_COMMUNITY): Payer: Self-pay | Admitting: Neurosurgery

## 2024-08-02 VITALS — BP 113/75 | HR 82 | Temp 98.1°F | Ht 67.0 in | Wt 164.0 lb

## 2024-08-02 DIAGNOSIS — R42 Dizziness and giddiness: Secondary | ICD-10-CM

## 2024-08-02 DIAGNOSIS — E236 Other disorders of pituitary gland: Secondary | ICD-10-CM

## 2024-08-02 DIAGNOSIS — D352 Benign neoplasm of pituitary gland: Secondary | ICD-10-CM

## 2024-08-02 DIAGNOSIS — R519 Headache, unspecified: Secondary | ICD-10-CM | POA: Diagnosis not present

## 2024-08-02 DIAGNOSIS — Q2730 Arteriovenous malformation, site unspecified: Secondary | ICD-10-CM

## 2024-08-02 NOTE — Progress Notes (Signed)
 " Assessment : Discussed the use of AI scribe software for clinical note transcription with the patient, who gave verbal consent to proceed.  History of Present Illness Robert Richardson is a 54 year old male with hypertension who presents with acute onset headache and dizziness.  Over the past two weeks, he has experienced recurrent episodes of headache and dizziness. The initial episode was associated with a home blood pressure reading slightly above 140 mmHg systolic, which improved after taking his antihypertensive medication. His usual morning blood pressure readings are 130-140 mmHg systolic and 80 mmHg diastolic prior to medication.  The most severe episode occurred on a Saturday morning after his usual antihypertensive dose, during which he developed significant dizziness and shortness of breath near the end of a one-mile walk. Upon returning home, he experienced severe dizziness and headache, initially with normal blood pressure, followed by an acute increase in blood pressure within 20-30 minutes. He described a sensation of blood flooding into my head with severe headache. Symptoms partially subsided after an hour of rest but recurred with minimal activity. EMS recorded a blood pressure of 162/102 mmHg. He took a traditional Chinese medicine prior to transport. In the emergency department, his blood pressure was reduced and his headache improved but did not fully resolve. He denied chest pain, palpitations, lower extremity edema, loss or change in consciousness, seizure activity, numbness, or weakness.  He was diagnosed with hypertension in 2021 and began antihypertensive therapy in 2023. He takes his medication regularly and has not had prior similar episodes. No other chronic medical conditions aside from hypertension and prior right renal cell carcinoma status post partial nephrectomy. Weight has remained stable since surgery.    Plan : 54 year old gentleman who recently was in our emergency  room for headaches and dizziness.  He says that he has had this now for the past month.  Going back, he was diagnosed with the right renal tumor in 2003 and in March of last year had this removed.  This was malignant according to him and follow-up has been negative fortunately.  I am not sure that this is related to it but it is something that we will have to keep in mind.  I went over the findings with him and his wife and his wife recorded this conversation on her phone with the translation software that translates it to Mandarin and he recorded on his phone as a video.  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  I told him that the treatment of this depends upon size, location and the vascular configuration which I would like to elucidate with a diagnostic angiogram.  I explained to them what this entails and I told him about the risks of the diagnostic angiogram as well.  Depending on the findings of the angiogram, we will decide what the best next steps are.  I am not entirely convinced that there is an intra nidal aneurysm based upon the imaging but the angiogram is going to give us  the best details.  Secondly, he also has what seems like a pituitary adenoma and I explained to them what this entails.  In order to do the workup for this I would like to get a MRI with and without contrast and I would also like to get some blood work done.  This will give us  a better understanding of whether this  is a hormonally active tumor or not.  I told him that his job is to get his blood pressure down and it is very well possible that his blood pressure is elevated from another reason but pituitary adenoma can also cause headaches which can result lead to hypertension.  He has an appointment with his primary care doctor to address his blood pressure.  I told him that I will try to get the  angiogram done tomorrow and hopefully we will get approval from his insurance company for this.  If not, then it will have to be the week after.  I will also get him to get the blood drawn and get the MRI and see him back thereafter.  He voiced understanding and agreement with this plan.   Social History   Socioeconomic History   Marital status: Married    Spouse name: Not on file   Number of children: Not on file   Years of education: Not on file   Highest education level: Not on file  Occupational History   Not on file  Tobacco Use   Smoking status: Never   Smokeless tobacco: Never  Vaping Use   Vaping status: Never Used  Substance and Sexual Activity   Alcohol use: Yes    Comment: occas   Drug use: Never   Sexual activity: Not on file  Other Topics Concern   Not on file  Social History Narrative   Not on file   Social Drivers of Health   Tobacco Use: Low Risk (08/02/2024)   Patient History    Smoking Tobacco Use: Never    Smokeless Tobacco Use: Never    Passive Exposure: Not on file  Financial Resource Strain: Not on file  Food Insecurity: No Food Insecurity (10/15/2023)   Hunger Vital Sign    Worried About Running Out of Food in the Last Year: Never true    Ran Out of Food in the Last Year: Never true  Transportation Needs: No Transportation Needs (10/15/2023)   PRAPARE - Administrator, Civil Service (Medical): No    Lack of Transportation (Non-Medical): No  Physical Activity: Not on file  Stress: Not on file  Social Connections: Not on file  Intimate Partner Violence: Not At Risk (10/15/2023)   Humiliation, Afraid, Rape, and Kick questionnaire    Fear of Current or Ex-Partner: No    Emotionally Abused: No    Physically Abused: No    Sexually Abused: No  Depression (PHQ2-9): Not on file  Alcohol Screen: Not on file  Housing: Low Risk (10/15/2023)   Housing Stability Vital Sign    Unable to Pay for Housing in the Last Year: No    Number of Times  Moved in the Last Year: 0    Homeless in the Last Year: No  Utilities: Not At Risk (10/15/2023)   AHC Utilities    Threatened with loss of utilities: No  Health Literacy: Not on file    History reviewed. No pertinent family history.  Allergies[1]  Past Medical History:  Diagnosis Date   Hypertension     Past Surgical History:  Procedure Laterality Date   ROBOT ASSISTED LAPAROSCOPIC NEPHRECTOMY Right 10/15/2023   Procedure: XI ROBOTIC ASSISTED  PARTIAL LAPAROSCOPIC RIGHT NEPHRECTOMY;  Surgeon: Cam Morene ORN, MD;  Location: [facility];  Service: Urology;  Laterality: Right;  210 MINUTE CASE     Physical Exam   Physical Exam HENT:     Head: Normocephalic.  Nose: Nose normal.  Eyes:     Pupils: Pupils are equal, round, and reactive to light.  Cardiovascular:     Rate and Rhythm: Normal rate.  Pulmonary:     Effort: Pulmonary effort is normal.  Abdominal:     General: Abdomen is flat.  Musculoskeletal:     Cervical back: Normal range of motion.  Neurological:     Mental Status: Patient is alert.     Cranial Nerves: Cranial nerves 2-12 are intact.     Sensory: Sensation is intact.     Motor: Motor function is intact.     Coordination: Coordination is intact.     No results found for this or any previous visit.     [1] No Known Allergies  "

## 2024-08-03 ENCOUNTER — Other Ambulatory Visit (HOSPITAL_COMMUNITY): Payer: Self-pay | Admitting: Neurosurgery

## 2024-08-03 ENCOUNTER — Ambulatory Visit (HOSPITAL_COMMUNITY): Admission: RE | Admit: 2024-08-03 | Discharge: 2024-08-03 | Attending: Neurosurgery | Admitting: Neurosurgery

## 2024-08-03 ENCOUNTER — Other Ambulatory Visit: Payer: Self-pay

## 2024-08-03 DIAGNOSIS — Q2730 Arteriovenous malformation, site unspecified: Secondary | ICD-10-CM

## 2024-08-03 DIAGNOSIS — D352 Benign neoplasm of pituitary gland: Secondary | ICD-10-CM | POA: Diagnosis present

## 2024-08-03 DIAGNOSIS — Z905 Acquired absence of kidney: Secondary | ICD-10-CM | POA: Diagnosis not present

## 2024-08-03 MED ORDER — IOHEXOL 300 MG/ML  SOLN
50.0000 mL | Freq: Once | INTRAMUSCULAR | Status: AC | PRN
  Administered 2024-08-03: 25 mL via INTRA_ARTERIAL

## 2024-08-03 MED ORDER — LIDOCAINE HCL 1 % IJ SOLN
20.0000 mL | Freq: Once | INTRAMUSCULAR | Status: AC
  Administered 2024-08-03: 5 mL via INTRADERMAL

## 2024-08-03 MED ORDER — VERAPAMIL HCL 2.5 MG/ML IV SOLN
INTRAVENOUS | Status: AC
  Filled 2024-08-03: qty 2

## 2024-08-03 MED ORDER — FENTANYL CITRATE (PF) 100 MCG/2ML IJ SOLN
INTRAMUSCULAR | Status: AC
  Filled 2024-08-03: qty 2

## 2024-08-03 MED ORDER — NITROGLYCERIN 1 MG/10 ML FOR IR/CATH LAB
INTRA_ARTERIAL | Status: AC
  Filled 2024-08-03: qty 10

## 2024-08-03 MED ORDER — FENTANYL CITRATE (PF) 100 MCG/2ML IJ SOLN
INTRAMUSCULAR | Status: AC | PRN
  Administered 2024-08-03: 50 ug via INTRAVENOUS

## 2024-08-03 MED ORDER — MIDAZOLAM HCL (PF) 2 MG/2ML IJ SOLN
INTRAMUSCULAR | Status: AC | PRN
  Administered 2024-08-03: 1 mg via INTRAVENOUS

## 2024-08-03 MED ORDER — MIDAZOLAM HCL 2 MG/2ML IJ SOLN
INTRAMUSCULAR | Status: AC
  Filled 2024-08-03: qty 2

## 2024-08-03 MED ORDER — IOHEXOL 300 MG/ML  SOLN
50.0000 mL | Freq: Once | INTRAMUSCULAR | Status: AC | PRN
  Administered 2024-08-03: 50 mL via INTRA_ARTERIAL

## 2024-08-03 MED ORDER — HEPARIN SODIUM (PORCINE) 1000 UNIT/ML IJ SOLN
INTRAMUSCULAR | Status: AC
  Filled 2024-08-03: qty 10

## 2024-08-03 MED ORDER — VERAPAMIL HCL 2.5 MG/ML IV SOLN: INTRA_ARTERIAL | Status: AC | PRN

## 2024-08-03 MED ORDER — LIDOCAINE HCL 1 % IJ SOLN
INTRAMUSCULAR | Status: AC
  Filled 2024-08-03: qty 20

## 2024-08-03 NOTE — Discharge Instructions (Signed)

## 2024-08-03 NOTE — Progress Notes (Signed)
 TRB removed, transparent tape applied over 2x2 gauze. Medipore tape applied.  No active bleeding noted, RB - A.

## 2024-08-03 NOTE — H&P (Signed)
 54yo gentleman with a LEFT Temporal AVM  Past Medical History:  Diagnosis Date   Hypertension    Past Surgical History:  Procedure Laterality Date   ROBOT ASSISTED LAPAROSCOPIC NEPHRECTOMY Right 10/15/2023   Procedure: XI ROBOTIC ASSISTED  PARTIAL LAPAROSCOPIC RIGHT NEPHRECTOMY;  Surgeon: Cam Morene ORN, MD;  Location: [facility];  Service: Urology;  Laterality: Right;  210 MINUTE CASE   Social History   Socioeconomic History   Marital status: Married    Spouse name: Not on file   Number of children: Not on file   Years of education: Not on file   Highest education level: Not on file  Occupational History   Not on file  Tobacco Use   Smoking status: Never   Smokeless tobacco: Never  Vaping Use   Vaping status: Never Used  Substance and Sexual Activity   Alcohol use: Yes    Comment: occas   Drug use: Never   Sexual activity: Not on file  Other Topics Concern   Not on file  Social History Narrative   Not on file   Social Drivers of Health   Tobacco Use: Low Risk (08/02/2024)   Patient History    Smoking Tobacco Use: Never    Smokeless Tobacco Use: Never    Passive Exposure: Not on file  Financial Resource Strain: Not on file  Food Insecurity: No Food Insecurity (10/15/2023)   Hunger Vital Sign    Worried About Running Out of Food in the Last Year: Never true    Ran Out of Food in the Last Year: Never true  Transportation Needs: No Transportation Needs (10/15/2023)   PRAPARE - Administrator, Civil Service (Medical): No    Lack of Transportation (Non-Medical): No  Physical Activity: Not on file  Stress: Not on file  Social Connections: Not on file  Intimate Partner Violence: Not At Risk (10/15/2023)   Humiliation, Afraid, Rape, and Kick questionnaire    Fear of Current or Ex-Partner: No    Emotionally Abused: No    Physically Abused: No    Sexually Abused: No  Depression (PHQ2-9): Not on file  Alcohol Screen: Not on file  Housing: Low Risk  (10/15/2023)   Housing Stability Vital Sign    Unable to Pay for Housing in the Last Year: No    Number of Times Moved in the Last Year: 0    Homeless in the Last Year: No  Utilities: Not At Risk (10/15/2023)   AHC Utilities    Threatened with loss of utilities: No  Health Literacy: Not on file   No family history on file. Allergies[1] Scheduled Meds: Continuous Infusions: PRN Meds:. Vitals:   08/03/24 0800  BP: 103/72  Pulse: 61  Temp: 97.8 F (36.6 C)  SpO2: 98%   Physical Exam HENT:     Head: Normocephalic.     Nose: Nose normal.  Eyes:     Pupils: Pupils are equal, round, and reactive to light.  Cardiovascular:     Rate and Rhythm: Normal rate.  Pulmonary:     Effort: Pulmonary effort is normal.  Abdominal:     General: Abdomen is flat.  Musculoskeletal:     Cervical back: Normal range of motion.  Neurological:     Mental Status: He is alert.   ASSESSMENT: AVM  PLAN: DIAGNOSTIC ANGIOGRAM  M:1 ASA:1     [1] No Known Allergies

## 2024-08-03 NOTE — Progress Notes (Addendum)
 Patient tolerated a regular diet without emesis. Right radial site with TRB in place, no active bleeding noted. Discharge instructions reviewed with patient and his wife; copy given.

## 2024-08-03 NOTE — Progress Notes (Signed)
 Covering for [employer] while on lunch, patient discharged,IV removed, patient and family member state they do not have any questions in regards to discharge education at this time.

## 2024-08-08 ENCOUNTER — Ambulatory Visit (HOSPITAL_COMMUNITY)
Admission: RE | Admit: 2024-08-08 | Discharge: 2024-08-08 | Disposition: A | Discharge status: Home / Self Care | Source: Ambulatory Visit | Attending: Neurosurgery | Admitting: Neurosurgery

## 2024-08-08 DIAGNOSIS — D352 Benign neoplasm of pituitary gland: Secondary | ICD-10-CM | POA: Diagnosis present

## 2024-08-08 DIAGNOSIS — Q282 Arteriovenous malformation of cerebral vessels: Secondary | ICD-10-CM | POA: Diagnosis not present

## 2024-08-08 MED ORDER — GADOBUTROL 1 MMOL/ML IV SOLN
5.0000 mL | Freq: Once | INTRAVENOUS | Status: AC | PRN
  Administered 2024-08-08: 5 mL via INTRAVENOUS

## 2024-08-12 ENCOUNTER — Ambulatory Visit: Admitting: Neurosurgery

## 2024-08-12 ENCOUNTER — Encounter: Payer: Self-pay | Admitting: Neurosurgery

## 2024-08-12 VITALS — BP 116/77 | HR 68 | Temp 98.0°F | Ht 67.0 in | Wt 159.0 lb

## 2024-08-12 DIAGNOSIS — D497 Neoplasm of unspecified behavior of endocrine glands and other parts of nervous system: Secondary | ICD-10-CM

## 2024-08-12 DIAGNOSIS — Q282 Arteriovenous malformation of cerebral vessels: Secondary | ICD-10-CM

## 2024-08-12 DIAGNOSIS — Q2730 Arteriovenous malformation, site unspecified: Secondary | ICD-10-CM

## 2024-08-12 HISTORY — DX: Arteriovenous malformation, site unspecified: Q27.30

## 2024-08-12 HISTORY — DX: Neoplasm of unspecified behavior of endocrine glands and other parts of nervous system: D49.7

## 2024-08-12 NOTE — Progress Notes (Signed)
 54 year old gentleman with headaches and dizziness who was found to have a left temporal arteriovenous malformation and a pituitary tumor.  He returns with his wife today.  His wife recorded this conversation on her phone through the translator into Chinese and the patient recorded this conversation on his phone as well.  We went over the 2 issues at play here: Firstly, we talked about his pituitary tumor.  This is a large pituitary tumor with upward deviation of the chiasm.  It appears as a classic adenoma and the hormonal workup did not demonstrate any pituitary dysfunction or any overproduction of any hormones.  I went over the options with him and his wife of doing nothing with serial imaging versus surgical resection.  I also talked about radiation but I told them that due to the proximity with the chiasm I do not recommend that.  We talked about the endonasal approach and I told him that the concern is that at his age, continued growth could result in visual decline and that surgical resection would be the best remedy to ensure that that does not happen though serial imaging and serial eye exams would also be an option.  He said that he does not want to go that route and wants to have this removed.  In light of the other abnormality, I told him that I would not think that this would be our first procedure.  Secondly, we talked about the arteriovenous malformation and I showed them the angiogram.  We talked about the blood supply to it and we talked about the options of doing nothing versus embolization and resection and lastly stereotactic radiosurgery.  I talked about the risks and benefits of all 3 options with them.  I told him that given the fact that he wants to have something done about it the risk of surgery is speech impairment, memory issues, seizures alongside other general surgical concerns but the benefit of surgery is the removal of this arteriovenous malformation.  The benefit of  stereotactic radiosurgery is that it is not invasive however it does not work immediately and it takes some years, depending on the size, how quickly the arteriovenous malformation goes away.  I offered him a consultation with our radiation oncologist but he decided that he does not want to wait and wants to have this removed.  I told him that the endovascular treatment would be done by my partner Dr. Todd Burns, interventional neuroradiologist, and that patient would go to surgery while intubated to have this removed.  Patient wants to expedite this even though he is feeling better, and wants to have this done as soon as possible.  I will coordinate his care with my partner and see how quickly we can get him on the books to get this done.  Once he has completely healed from his arteriovenous malformation resection, we will talk about the pituitary tumor.  I told him that if everything goes well he will go home the next day and could go back to work after 3 weeks and went over this timeline in great detail with them.

## 2024-08-13 LAB — TSH+FREE T4: TSH W/REFLEX TO FT4: 1.65 m[IU]/L (ref 0.40–4.50)

## 2024-08-13 LAB — PROLACTIN: Prolactin: 6.2 ng/mL (ref 2.0–18.0)

## 2024-08-13 LAB — CORTISOL: Cortisol, Plasma: 13.4 ug/dL

## 2024-08-13 LAB — FSH/LH
FSH: 7.5 m[IU]/mL (ref 1.4–12.8)
LH: 2.9 m[IU]/mL (ref 1.5–9.3)

## 2024-08-13 LAB — INSULIN-LIKE GROWTH FACTOR
IGF-I, LC/MS: 197 ng/mL (ref 50–317)
Z-Score (Male): 0.7 {STDV}

## 2024-08-15 ENCOUNTER — Encounter (HOSPITAL_COMMUNITY): Payer: Self-pay

## 2024-08-15 NOTE — Op Note (Signed)
 IR Angiogram Cerebral  Date of Surgery: 08/03/2024  Surgeon: Dino CHRISTELLA Sable, MD   Narrative & Impression TECHNIQUE: Diagnostic cerebral angiogram   PREOPERATIVE DIAGNOSIS: LEFT Temporal AVM POSTOPERATIVE DIAGNOSIS: Same   PROCEDURE PERFORMED:  1: Ultrasound-guided right  Radial artery access 2: Multivessel diagnostic angiography 4: Closure of the right radial artery access with closure device: TR Band   DEVICES USED:  1: 5 French sheath 2: 5 French Simmons 2 catheter 3: 0.038 inch Glidewire 4: Closure with: TR Band   VESSELS CATHETERIZED: 1: Right radial artery 2: Aortic Arch 3: Right common carotid artery 4: Right vertebral artery 5: Left common carotid artery 6: Left vertebral artery 7: Right brachial artery 8: Right subclavian artery 9: Left subclavian artery 10: Left External Carotid Artery   ANESTHESIA: Local anesthesia and conscious sedation. 25 mcg of fentanyl  and 1 mg of Versed  from 09:30 am through 09:50 am Under my direct supervision, medications as listed in the procedure log were administered intravenously by the sedation trained personnel for moderate sedation. Pulse oximetry, heart rate, and blood pressure were continuously monitored by an independent trained observer present.   COMPLICATIONS: None   PREOPERATIVE COURSE: Patient is a 54 year old gentleman with a history of incidental finding of a LEFT Temporal vascular malformation who was seen in our outpatient clinic. Patient's films were reviewed with the patient and the family and options were discussed with them including but not limited to noninvasive imaging, observation, angiography amongst others. Benefits and risks of each option were discussed with them in layman's terms with the risks of the angiogram including but not limited to infection, hemorrhage, stroke, paralysis, blindness, speech impairment, renal failure, DVT, PE, cardio pulmonary complications and death amongst others. All the patient's  questions were answered to their satisfaction as voiced by them and they requested for us  to proceed.   DESCRIPTION OF PROCEDURE: Patient was brought to the angio suite and after patient was positioned and all pressure points padded, patient was prepped and draped in usual sterile fashion. After injection of local anesthetic, ultrasound guidance was used to evaluate the right wrist site; patency of the right radial artery was noted and documented in PACS. Using a standard micropuncture kit with ultrasound guidance realtime visualization, the micropuncture needle was advanced into the right radial artery and intravascular location of the needle tip was confirmed on ultrasound. Thereafter using a modified Seldinger technique a 5 French sheath was inserted. The diagnostic catheter was navigated over the aortic arch and vessels were selectively catheterized over a 0.038 inch Glidewire. After the images were obtained they were reviewed and found to be of diagnostic quality. The diagnostic catheter and wire were then removed and the sheath discontinued with closure of the access site with TR Band At the end of the procedure patient's pulses were present.   I was present for the entire procedure.    Right common carotid artery cervical view, AP and lateral views demonstrates the common carotid artery which courses superiorly and bifurcates into the external the internal carotid arteries there is minimal disease burden at the origin of the right internal carotid artery as it courses superiorly into the skull base.   Right common carotid artery cranial view, AP and lateral view demonstrates the internal carotid artery coursing into the skull base through the petrous, lacerum and cavernous segments and then in the post clinoid segment continuing superiorly and bifurcating into the ACA as well as the MCA. The middle cerebral artery bifurcation then continues laterally  into the convexity branches. The anterior cerebral  artery continues medially into the anterior cerebral artery circulation. There is no aneurysm appreciated at the right MCA bifurcation   Left common carotid artery cervical view, AP and lateral views demonstrates the common carotid artery which courses superiorly and bifurcates into the external the internal carotid arteries there is minimal disease burden at the origin of the right internal carotid artery as it courses superiorly into the skull base.  Left external carotid artery injection cranial view demonstrates the internal maxillary artery with its branches as well as the lingual artery, extending pharyngeal artery and the superficial temporal artery.  There is no contribution to the vascular malformation from the external carotid artery run.   Left common carotid artery injection cranial AP and lateral views demonstrates internal carotid artery coursing through the skull base through the petrous, lacerum and cavernous segments and then in the posterior clinoid segment continuing superiorly and bifurcating into the ACA as well as the MCA. The middle cerebral artery bifurcation and continues laterally into the convexity branches. The anterior cerebral artery continues medially into the anterior cerebral artery circulation.  There is an 8 x 8 x 4.8 mm left temporal arteriovenous malformation which is fed by small branches from the anterior temporal artery, small branches from the superior division and a larger branch from it.  There is a single draining vein originating from the inferior surface which courses anteriorly, superiorly and then medially to drain into the cavernous sinus   Left subclavian artery injection demonstrates the origin of the left vertebral artery without any significant stenosis. The artery then continues superiorly in a tortuous fashion.   Left vertebral artery injection cranial AP and lateral view demonstrates the left V2, V3 and V4 segments as it continues into the basilar  artery which continues superiorly into the posterior cerebral circulation.   Right vertebral artery injection cranial AP and lateral view demonstrates the right V2, V3 and V4 segments as it continues into the basilar artery which continues superiorly into the posterior cerebral circulation.      IMPRESSION: Heloise Lunger grade II 8.8 mm x 4.8 mm left temporal AVM with a single draining vein   PLAN: Patient transferred to the recovery room

## 2024-08-16 ENCOUNTER — Other Ambulatory Visit (HOSPITAL_COMMUNITY): Payer: Self-pay | Admitting: Neuroradiology

## 2024-08-16 ENCOUNTER — Other Ambulatory Visit: Payer: Self-pay

## 2024-08-16 ENCOUNTER — Ambulatory Visit: Admitting: Neurosurgery

## 2024-08-16 DIAGNOSIS — Q282 Arteriovenous malformation of cerebral vessels: Secondary | ICD-10-CM

## 2024-08-19 ENCOUNTER — Encounter (HOSPITAL_COMMUNITY): Payer: Self-pay | Admitting: Neuroradiology

## 2024-08-19 ENCOUNTER — Other Ambulatory Visit: Payer: Self-pay

## 2024-08-19 NOTE — Progress Notes (Signed)
 PCP - Dr Garnette Gleason Cardiologist - none  Chest x-ray - 08/01/24 EKG - 08/01/24 Stress Test - n/a ECHO - n/a Cardiac Cath - n/a  ICD Pacemaker/Loop - n/a  Sleep Study -  n/a  Diabetes - n/a  Aspirin & Blood Thinner Instructions:  n/a  NPO  Anesthesia review: no  STOP now taking any Aspirin (unless otherwise instructed by your surgeon), Aleve, Naproxen, Ibuprofen, Motrin, Advil, Goody's, BC's, all herbal medications, fish oil, and all vitamins.   Coronavirus Screening Do you have any of the following symptoms:  Cough yes/no: No Fever (>100.15F)  yes/no: No Runny nose yes/no: No Sore throat yes/no: No Difficulty breathing/shortness of breath  yes/no: No  Have you traveled in the last 14 days and where? yes/no: No  Patient verbalized understanding of instructions that were given via phone.  Husband stated that his wife Rosario Flatten also speaks English.

## 2024-08-22 ENCOUNTER — Encounter (HOSPITAL_COMMUNITY): Admission: RE | Disposition: A | Payer: Self-pay | Source: Home / Self Care | Attending: Neurosurgery

## 2024-08-22 ENCOUNTER — Encounter (HOSPITAL_COMMUNITY): Admitting: Anesthesiology

## 2024-08-22 ENCOUNTER — Inpatient Hospital Stay (HOSPITAL_COMMUNITY)
Admission: RE | Admit: 2024-08-22 | Discharge: 2024-08-24 | Disposition: A | Source: Home / Self Care | Attending: Neurosurgery | Admitting: Neurosurgery

## 2024-08-22 ENCOUNTER — Inpatient Hospital Stay (HOSPITAL_COMMUNITY)

## 2024-08-22 ENCOUNTER — Other Ambulatory Visit: Payer: Self-pay

## 2024-08-22 DIAGNOSIS — Q282 Arteriovenous malformation of cerebral vessels: Principal | ICD-10-CM

## 2024-08-22 DIAGNOSIS — D352 Benign neoplasm of pituitary gland: Secondary | ICD-10-CM

## 2024-08-22 DIAGNOSIS — Z9889 Other specified postprocedural states: Secondary | ICD-10-CM | POA: Diagnosis not present

## 2024-08-22 DIAGNOSIS — I10 Essential (primary) hypertension: Secondary | ICD-10-CM | POA: Diagnosis not present

## 2024-08-22 DIAGNOSIS — D497 Neoplasm of unspecified behavior of endocrine glands and other parts of nervous system: Secondary | ICD-10-CM | POA: Diagnosis not present

## 2024-08-22 LAB — CBC
HCT: 34.9 % — ABNORMAL LOW (ref 39.0–52.0)
Hemoglobin: 12.3 g/dL — ABNORMAL LOW (ref 13.0–17.0)
MCH: 31.9 pg (ref 26.0–34.0)
MCHC: 35.2 g/dL (ref 30.0–36.0)
MCV: 90.4 fL (ref 80.0–100.0)
Platelets: 177 10*3/uL (ref 150–400)
RBC: 3.86 MIL/uL — ABNORMAL LOW (ref 4.22–5.81)
RDW: 11.4 % — ABNORMAL LOW (ref 11.5–15.5)
WBC: 8.6 10*3/uL (ref 4.0–10.5)
nRBC: 0 % (ref 0.0–0.2)

## 2024-08-22 LAB — BASIC METABOLIC PANEL WITH GFR
Anion gap: 8 (ref 5–15)
BUN: 15 mg/dL (ref 6–20)
CO2: 22 mmol/L (ref 22–32)
Calcium: 8.2 mg/dL — ABNORMAL LOW (ref 8.9–10.3)
Chloride: 109 mmol/L (ref 98–111)
Creatinine, Ser: 0.69 mg/dL (ref 0.61–1.24)
GFR, Estimated: >60 mL/min
Glucose, Bld: 137 mg/dL — ABNORMAL HIGH (ref 70–99)
Potassium: 3.6 mmol/L (ref 3.5–5.1)
Sodium: 139 mmol/L (ref 135–145)

## 2024-08-22 LAB — GLUCOSE, CAPILLARY
Glucose-Capillary: 126 mg/dL — ABNORMAL HIGH (ref 70–99)
Glucose-Capillary: 134 mg/dL — ABNORMAL HIGH (ref 70–99)
Glucose-Capillary: 147 mg/dL — ABNORMAL HIGH (ref 70–99)

## 2024-08-22 LAB — TYPE AND SCREEN
ABO/RH(D): B POS
Antibody Screen: NEGATIVE

## 2024-08-22 MED ORDER — POTASSIUM CHLORIDE 10 MEQ/100ML IV SOLN
INTRAVENOUS | Status: DC | PRN
  Administered 2024-08-22: 10 meq via INTRAVENOUS

## 2024-08-22 MED ORDER — VANCOMYCIN HCL 1000 MG IV SOLR
INTRAVENOUS | Status: DC | PRN
  Administered 2024-08-22: 1000 mg

## 2024-08-22 MED ORDER — POLYETHYLENE GLYCOL 3350 17 G PO PACK
17.0000 g | PACK | Freq: Every day | ORAL | Status: DC | PRN
  Filled 2024-08-22: qty 1

## 2024-08-22 MED ORDER — CHLORHEXIDINE GLUCONATE 0.12 % MT SOLN
15.0000 mL | Freq: Once | OROMUCOSAL | Status: AC
  Administered 2024-08-22: 15 mL via OROMUCOSAL
  Filled 2024-08-22: qty 15

## 2024-08-22 MED ORDER — LACTATED RINGERS IV SOLN: INTRAVENOUS | Status: AC

## 2024-08-22 MED ORDER — DEXAMETHASONE 4 MG PO TABS: 4.0000 mg | ORAL_TABLET | Freq: Three times a day (TID) | ORAL | Status: DC

## 2024-08-22 MED ORDER — PROPOFOL 500 MG/50ML IV EMUL
INTRAVENOUS | Status: DC | PRN
  Administered 2024-08-22: 150 ug/kg/min via INTRAVENOUS

## 2024-08-22 MED ORDER — CEFAZOLIN SODIUM-DEXTROSE 2-4 GM/100ML-% IV SOLN
2.0000 g | Freq: Once | INTRAVENOUS | Status: AC
  Administered 2024-08-22: 2 g via INTRAVENOUS
  Filled 2024-08-22: qty 100

## 2024-08-22 MED ORDER — LEVETIRACETAM (KEPPRA) 500 MG/5 ML ADULT IV PUSH
500.0000 mg | Freq: Two times a day (BID) | INTRAVENOUS | Status: DC
  Administered 2024-08-22 – 2024-08-23 (×2): 500 mg via INTRAVENOUS
  Filled 2024-08-22 (×2): qty 5

## 2024-08-22 MED ORDER — PROMETHAZINE HCL 25 MG PO TABS
12.5000 mg | ORAL_TABLET | ORAL | Status: DC | PRN
  Administered 2024-08-23 (×2): 25 mg via ORAL
  Filled 2024-08-22 (×2): qty 1

## 2024-08-22 MED ORDER — IRBESARTAN 150 MG PO TABS: 150.0000 mg | ORAL_TABLET | Freq: Every day | ORAL | Status: DC

## 2024-08-22 MED ORDER — PHENYLEPHRINE HCL-NACL 20-0.9 MG/250ML-% IV SOLN
INTRAVENOUS | Status: DC | PRN
  Administered 2024-08-22: 25 ug/min via INTRAVENOUS

## 2024-08-22 MED ORDER — FENTANYL CITRATE (PF) 250 MCG/5ML IJ SOLN
INTRAMUSCULAR | Status: DC | PRN
  Administered 2024-08-22: 100 ug via INTRAVENOUS

## 2024-08-22 MED ORDER — OXYCODONE HCL 5 MG PO TABS: 5.0000 mg | ORAL_TABLET | Freq: Once | ORAL | Status: DC | PRN

## 2024-08-22 MED ORDER — ACETAMINOPHEN 10 MG/ML IV SOLN
INTRAVENOUS | Status: DC | PRN
  Administered 2024-08-22: 1000 mg via INTRAVENOUS

## 2024-08-22 MED ORDER — HEMOSTATIC AGENTS (NO CHARGE) OPTIME
TOPICAL | Status: DC | PRN
  Administered 2024-08-22: 1 via TOPICAL

## 2024-08-22 MED ORDER — ORAL CARE MOUTH RINSE: 15.0000 mL | OROMUCOSAL | Status: DC | PRN

## 2024-08-22 MED ORDER — FENTANYL CITRATE (PF) 100 MCG/2ML IJ SOLN
INTRAMUSCULAR | Status: AC
  Filled 2024-08-22: qty 2

## 2024-08-22 MED ORDER — LACTATED RINGERS IV SOLN: INTRAVENOUS | Status: DC

## 2024-08-22 MED ORDER — BACLOFEN 10 MG PO TABS
10.0000 mg | ORAL_TABLET | Freq: Two times a day (BID) | ORAL | Status: DC
  Administered 2024-08-23 – 2024-08-24 (×3): 10 mg via ORAL
  Filled 2024-08-22 (×4): qty 1

## 2024-08-22 MED ORDER — ACETAMINOPHEN 650 MG RE SUPP: 650.0000 mg | RECTAL | Status: DC | PRN

## 2024-08-22 MED ORDER — ACETAMINOPHEN 10 MG/ML IV SOLN: 1000.0000 mg | Freq: Once | INTRAVENOUS | Status: DC | PRN

## 2024-08-22 MED ORDER — VANCOMYCIN HCL 1000 MG IV SOLR
INTRAVENOUS | Status: AC
  Filled 2024-08-22: qty 20

## 2024-08-22 MED ORDER — ONDANSETRON HCL 4 MG PO TABS
4.0000 mg | ORAL_TABLET | ORAL | Status: DC | PRN
  Administered 2024-08-24: 4 mg via ORAL
  Filled 2024-08-22 (×3): qty 1

## 2024-08-22 MED ORDER — BACITRACIN ZINC 500 UNIT/GM EX OINT
TOPICAL_OINTMENT | CUTANEOUS | Status: AC
  Filled 2024-08-22: qty 28.35

## 2024-08-22 MED ORDER — 0.9 % SODIUM CHLORIDE (POUR BTL) OPTIME
TOPICAL | Status: DC | PRN
  Administered 2024-08-22: 2000 mL

## 2024-08-22 MED ORDER — DEXAMETHASONE 4 MG PO TABS
6.0000 mg | ORAL_TABLET | Freq: Four times a day (QID) | ORAL | Status: AC
  Administered 2024-08-22 – 2024-08-23 (×2): 6 mg via ORAL
  Filled 2024-08-22 (×4): qty 2

## 2024-08-22 MED ORDER — SENNOSIDES-DOCUSATE SODIUM 8.6-50 MG PO TABS
1.0000 | ORAL_TABLET | Freq: Two times a day (BID) | ORAL | Status: DC
  Administered 2024-08-23 – 2024-08-24 (×2): 1 via ORAL
  Filled 2024-08-22 (×2): qty 1

## 2024-08-22 MED ORDER — PANTOPRAZOLE SODIUM 40 MG IV SOLR
40.0000 mg | Freq: Every day | INTRAVENOUS | Status: DC
  Administered 2024-08-22: 40 mg via INTRAVENOUS
  Filled 2024-08-22: qty 10

## 2024-08-22 MED ORDER — LABETALOL HCL 5 MG/ML IV SOLN
10.0000 mg | INTRAVENOUS | Status: DC | PRN
  Administered 2024-08-22: 10 mg via INTRAVENOUS
  Administered 2024-08-22: 20 mg via INTRAVENOUS
  Administered 2024-08-22: 10 mg via INTRAVENOUS
  Filled 2024-08-22: qty 4
  Filled 2024-08-22: qty 8

## 2024-08-22 MED ORDER — EPHEDRINE SULFATE-NACL 50-0.9 MG/10ML-% IV SOSY
PREFILLED_SYRINGE | INTRAVENOUS | Status: DC | PRN
  Administered 2024-08-22: 4 mg via INTRAVENOUS

## 2024-08-22 MED ORDER — LIDOCAINE-EPINEPHRINE 1 %-1:100000 IJ SOLN
INTRAMUSCULAR | Status: AC
  Filled 2024-08-22: qty 1

## 2024-08-22 MED ORDER — ORAL CARE MOUTH RINSE: 15.0000 mL | Freq: Once | OROMUCOSAL | Status: AC

## 2024-08-22 MED ORDER — PROPOFOL 10 MG/ML IV BOLUS
INTRAVENOUS | Status: DC | PRN
  Administered 2024-08-22: 150 mg via INTRAVENOUS

## 2024-08-22 MED ORDER — SODIUM CHLORIDE 0.9 % IV SOLN
0.1500 ug/kg/min | INTRAVENOUS | Status: AC
  Administered 2024-08-22: .1 ug/kg/min via INTRAVENOUS
  Filled 2024-08-22: qty 2000

## 2024-08-22 MED ORDER — FENTANYL CITRATE (PF) 50 MCG/ML IJ SOSY
25.0000 ug | PREFILLED_SYRINGE | INTRAMUSCULAR | Status: DC | PRN
  Administered 2024-08-22 – 2024-08-23 (×4): 25 ug via INTRAVENOUS
  Filled 2024-08-22 (×5): qty 1

## 2024-08-22 MED ORDER — SODIUM CHLORIDE 0.9 % IV SOLN: INTRAVENOUS | Status: DC | PRN

## 2024-08-22 MED ORDER — FENTANYL CITRATE (PF) 100 MCG/2ML IJ SOLN
25.0000 ug | INTRAMUSCULAR | Status: DC | PRN
  Administered 2024-08-22 (×2): 25 ug via INTRAVENOUS

## 2024-08-22 MED ORDER — BUTALBITAL-APAP-CAFFEINE 50-325-40 MG PO TABS
2.0000 | ORAL_TABLET | ORAL | Status: DC | PRN
  Administered 2024-08-22 – 2024-08-24 (×4): 2 via ORAL
  Filled 2024-08-22 (×4): qty 2

## 2024-08-22 MED ORDER — ONDANSETRON HCL 4 MG/2ML IJ SOLN
4.0000 mg | INTRAMUSCULAR | Status: DC | PRN
  Administered 2024-08-22 – 2024-08-24 (×8): 4 mg via INTRAVENOUS
  Filled 2024-08-22 (×8): qty 2

## 2024-08-22 MED ORDER — DEXAMETHASONE 4 MG PO TABS
4.0000 mg | ORAL_TABLET | Freq: Four times a day (QID) | ORAL | Status: DC
  Administered 2024-08-23 – 2024-08-24 (×3): 4 mg via ORAL
  Filled 2024-08-22 (×3): qty 1

## 2024-08-22 MED ORDER — ACETAMINOPHEN 325 MG PO TABS: 650.0000 mg | ORAL_TABLET | ORAL | Status: DC | PRN

## 2024-08-22 MED ORDER — DEXAMETHASONE SOD PHOSPHATE PF 10 MG/ML IJ SOLN
INTRAMUSCULAR | Status: DC | PRN
  Administered 2024-08-22: 10 mg via INTRAVENOUS

## 2024-08-22 MED ORDER — DROPERIDOL 2.5 MG/ML IJ SOLN: 0.6250 mg | Freq: Once | INTRAMUSCULAR | Status: DC | PRN

## 2024-08-22 MED ORDER — SUGAMMADEX SODIUM 200 MG/2ML IV SOLN
INTRAVENOUS | Status: DC | PRN
  Administered 2024-08-22: 200 mg via INTRAVENOUS

## 2024-08-22 MED ORDER — ROCURONIUM BROMIDE 10 MG/ML (PF) SYRINGE
PREFILLED_SYRINGE | INTRAVENOUS | Status: DC | PRN
  Administered 2024-08-22: 100 mg via INTRAVENOUS
  Administered 2024-08-22: 10 mg via INTRAVENOUS
  Administered 2024-08-22: 20 mg via INTRAVENOUS

## 2024-08-22 MED ORDER — LIDOCAINE 2% (20 MG/ML) 5 ML SYRINGE
INTRAMUSCULAR | Status: DC | PRN
  Administered 2024-08-22: 80 mg via INTRAVENOUS

## 2024-08-22 MED ORDER — CEFAZOLIN SODIUM-DEXTROSE 2-4 GM/100ML-% IV SOLN
2.0000 g | Freq: Three times a day (TID) | INTRAVENOUS | Status: AC
  Administered 2024-08-22 – 2024-08-23 (×3): 2 g via INTRAVENOUS
  Filled 2024-08-22 (×3): qty 100

## 2024-08-22 MED ORDER — SODIUM CHLORIDE 0.9 % IV SOLN
INTRAVENOUS | Status: DC | PRN
  Administered 2024-08-22: 1000 mg via INTRAVENOUS

## 2024-08-22 MED ORDER — LIDOCAINE-EPINEPHRINE 1 %-1:100000 IJ SOLN
INTRAMUSCULAR | Status: DC | PRN
  Administered 2024-08-22: 20 mL

## 2024-08-22 MED ORDER — CHLORHEXIDINE GLUCONATE CLOTH 2 % EX PADS
6.0000 | MEDICATED_PAD | Freq: Every day | CUTANEOUS | Status: DC
  Administered 2024-08-22 – 2024-08-23 (×2): 6 via TOPICAL

## 2024-08-22 MED ORDER — OXYCODONE HCL 5 MG/5ML PO SOLN: 5.0000 mg | Freq: Once | ORAL | Status: DC | PRN

## 2024-08-22 MED ORDER — AMLODIPINE BESYLATE 5 MG PO TABS: 5.0000 mg | ORAL_TABLET | Freq: Every day | ORAL | Status: DC

## 2024-08-22 NOTE — Anesthesia Procedure Notes (Addendum)
 Arterial Line Insertion Start/End2/08/2024 7:20 AM, 08/22/2024 7:25 AM Performed by: Boone Fess, MD, Christopher Comings, CRNA, anesthesiologist  Patient location: Pre-op. Preanesthetic checklist: patient identified, IV checked, site marked, risks and benefits discussed, surgical consent, monitors and equipment checked, pre-op evaluation, timeout performed and anesthesia consent Lidocaine  1% used for infiltration Left, radial was placed Catheter size: 20 G Hand hygiene performed , maximum sterile barriers used  and Seldinger technique used Allen's test indicative of satisfactory collateral circulation Attempts: 2 Procedure performed using ultrasound to evaluate access site. Ultrasound Notes:relevant anatomy identified, ultrasound used to visualize needle entry, vessel patent under ultrasound and image(s) printed for medical record. Following insertion, dressing applied. Post procedure assessment: normal and unchanged  Patient tolerated the procedure well with no immediate complications. Additional procedure comments: One unsuccessful attempt via palpation method by CRNA, followed by successful ultrasound guided attempt by MD..

## 2024-08-22 NOTE — Transfer of Care (Signed)
 Immediate Anesthesia Transfer of Care Note  Patient: Robert Richardson  Procedure(s) Performed: RADIOLOGY WITH ANESTHESIA LEFT TEMPORAL CRANIOTOMY FOR RESECTION OF ARTERIOVENOUS MALFORMATION WITH NAVIGATION (Left) COMPUTER-ASSISTED NAVIGATION, FOR CRANIAL PROCEDURE (Left)  Patient Location: PACU  Anesthesia Type:General  Level of Consciousness: awake, alert , oriented, and patient cooperative  Airway & Oxygen Therapy: Patient Spontanous Breathing and Patient connected to face mask oxygen  Post-op Assessment: Report given to RN, Post -op Vital signs reviewed and stable, Patient moving all extremities, and Patient moving all extremities X 4  Post vital signs: Reviewed and stable  Last Vitals:  Vitals Value Taken Time  BP 123/87 08/22/24 13:01  Temp    Pulse 81 08/22/24 13:04  Resp 18 08/22/24 13:04  SpO2 88 % 08/22/24 13:04  Vitals shown include unfiled device data.  Last Pain:  Vitals:   08/22/24 9361  TempSrc:   PainSc: 0-No pain         Complications: No notable events documented.

## 2024-08-22 NOTE — Consult Note (Signed)
 "  NAME:  Robert Richardson, MRN:  [id number], DOB:  07-29-1970, LOS: 0 ADMISSION DATE:  08/22/2024, CONSULTATION DATE:  08/22/24 REFERRING MD:  Dr. 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  Pt had elected to treat left temporal AVM first.  Admitted to NSGY and underwent left temporal craniotomy for resection of AVM.  Post op, admitted to Neuro ICU, PCCM consulted for medical management.   Pertinent  Medical History  HTN, left temporal AVM, pituitary tumor, prior right renal cell carcinoma 2003 s/p partial nephrectomy  Significant Hospital Events: Including procedures, antibiotic start and stop dates in addition to other pertinent events   2/2 left temporal craniotomy for resection of AVM  Interim History / Subjective:  Complains of mild left sided headache, denies vision changes  Objective    Blood pressure (!) 124/92, pulse 71, temperature 97.7 F (36.5 C), temperature source Oral, resp. rate 17, height 5' 7 (1.702 m), weight 73.5 kg, SpO2 98%.        Intake/Output Summary (Last 24 hours) at 08/22/2024 1130 Last data filed at 08/22/2024 1105 Gross per 24 hour  Intake 310 ml  Output --  Net 310 ml   Filed Weights   08/19/24 0839 08/22/24 0615  Weight: 72.1 kg 73.5 kg   Examination: General:  Adult male lying in bed in NAD HEENT: MM pink/moist, pupils 3/r, left frontal drain Neuro: Awake, oriented x 3, MAE, no focal deficits CV: rr, NSR, +2 radials PULM:  non labored, clear GI: soft, bs hypo, NT Extremities: warm/dry, no LE edema  Skin: no rashes  Patient Lines/Drains/Airways Status     Active Line/Drains/Airways     Name Placement date Placement time Site Days   Arterial Line 08/22/24 Left Radial 08/22/24  0720  Radial  less than 1   Peripheral IV 08/22/24 18 G Right Wrist 08/22/24   0705  Wrist  less than 1   Peripheral IV 08/22/24 16 G Right Arm 08/22/24  0715  Arm  less than 1   Closed System Drain 1 Left Scalp Bulb (JP) 10 Fr. 08/22/24  1235  Scalp  less than 1   Wound 08/22/24 1243 Surgical Closed Surgical Incision Head 08/22/24  1243  Head  less than 1           Resolved problem list   Assessment and Plan   Left temporal AVM s/p left temporal craniotomy for resection 08/22/24 P:  - post op per NSGY, monitoring in Neuro ICU - strict SBP goal 120-150, prn labetalol  vs cardene gtt if needed, cont Aline  - serial neuro exams/ neuroprotective measures  - further imaging per NSGY, CTH then plans for cerebral angiography 2/3  - decadron  taper per NSGY - keppra  for seizure ppx/ seizure precautions - monitor drain output - CBG q 4, add prn SSI if > 180 - multimodal pain management- scheduled baclofen , prn tylenol , fioricet , avoid/ minimize narcotics with bowel regimen - NPO pending SLP.  If CTH ok, advance to clears then NPO after midnight - LR at 50 ml/hr - SCDs for now - CBC and BMET now, trend BMET.  Replete electrolytes prn - strict I/Os   HTN - pta amlodipine  5mg , olmesartan 20mg  daily (total of 20mg  taken throughout day as all at once causes hypotension) P:  - SBP goal as  above, 120-150 - cont norvasc  5mg  daily, hold irbesartan  for now   Pituitary tumor - workup without pituitary dysfunction or overproduction of hormones - follow up with NSGY oupt with serial eye exams  Labs   CBC: No results for input(s): WBC, NEUTROABS, HGB, HCT, MCV, PLT in the last 168 hours.  Basic Metabolic Panel: No results for input(s): NA, K, CL, CO2, GLUCOSE, BUN, CREATININE, CALCIUM, MG, PHOS in the last 168 hours. GFR: Estimated Creatinine Clearance: 102.1 mL/min (by C-G formula based on SCr of 0.78 mg/dL). No results for input(s): PROCALCITON, WBC, LATICACIDVEN in the last 168 hours.  Liver Function Tests: No results for  input(s): AST, ALT, ALKPHOS, BILITOT, PROT, ALBUMIN in the last 168 hours. No results for input(s): LIPASE, AMYLASE in the last 168 hours. No results for input(s): AMMONIA in the last 168 hours.  ABG No results found for: PHART, PCO2ART, PO2ART, HCO3, TCO2, ACIDBASEDEF, O2SAT   Coagulation Profile: No results for input(s): INR, PROTIME in the last 168 hours.  Cardiac Enzymes: No results for input(s): CKTOTAL, CKMB, CKMBINDEX, TROPONINI in the last 168 hours.  HbA1C: No results found for: HGBA1C  CBG: No results for input(s): GLUCAP in the last 168 hours.  Review of Systems:   Review of Systems  Eyes:  Negative for blurred vision and double vision.  Respiratory:  Negative for sputum production.   Cardiovascular:  Negative for chest pain.  Gastrointestinal:  Negative for nausea and vomiting.  Neurological:  Positive for headaches. Negative for sensory change, speech change and focal weakness.   Past Medical History:  He,  has a past medical history of Arteriovenous malformation (08/12/2024), Hypertension, and Pituitary tumor (08/12/2024).   Surgical History:   Past Surgical History:  Procedure Laterality Date   COLONOSCOPY  07/2023   IR ANGIO EXTERNAL CAROTID SEL EXT CAROTID UNI L MOD SED  08/03/2024   IR ANGIO INTRA EXTRACRAN SEL COM CAROTID INNOMINATE UNI R MOD SED  08/03/2024   IR ANGIO INTRA EXTRACRAN SEL INTERNAL CAROTID UNI L MOD SED  08/03/2024   IR ANGIO VERTEBRAL SEL SUBCLAVIAN INNOMINATE UNI R MOD SED  08/03/2024   IR ANGIO VERTEBRAL SEL VERTEBRAL UNI L MOD SED  08/03/2024   IR US  GUIDE VASC ACCESS RIGHT  08/03/2024   ROBOT ASSISTED LAPAROSCOPIC NEPHRECTOMY Right 10/15/2023   Procedure: XI ROBOTIC ASSISTED  PARTIAL LAPAROSCOPIC RIGHT NEPHRECTOMY;  Surgeon: Cam Morene ORN, MD;  Location: [facility];  Service: Urology;  Laterality: Right;  210 MINUTE CASE   UPPER GI ENDOSCOPY  08/12/2024     Social History:    reports that he has never smoked. He has never used smokeless tobacco. He reports that he does not currently use alcohol. He reports that he does not use drugs.   Family History:  His family history is not on file.   Allergies Allergies[1]   Home Medications  Prior to Admission medications  Medication Sig Start Date End Date Taking? Authorizing Provider  amLODipine  (NORVASC ) 5 MG tablet Take 5 mg by mouth daily. 09/19/23  Yes [provider]  olmesartan (BENICAR) 20 MG tablet Take 20 mg by mouth daily. 09/19/23  Yes [provider]         CRITICAL CARE Performed by: Lyle Pesa   Total critical care time: 40 minutes  Critical care time was exclusive of separately billable procedures and treating other patients.  Critical care was necessary to treat or prevent imminent or life-threatening deterioration.  Critical care was time spent personally by  me on the following activities: development of treatment plan with patient and/or surrogate as well as nursing, discussions with consultants, evaluation of patient's response to treatment, examination of patient, obtaining history from patient or surrogate, ordering and performing treatments and interventions, ordering and review of laboratory studies, ordering and review of radiographic studies, pulse oximetry and re-evaluation of patient's condition.    Lyle Pesa, NP [facility] 08/22/2024, 2:27 PM  See Amion for pager If no response to pager , please call [phone] until 7pm After 7:00 pm call Elink  [phone]           [1] No Known Allergies  "

## 2024-08-22 NOTE — Discharge Instructions (Signed)
 Thank you for allowing us  at [facility] at [facility] to take care of you. It has been a privilege to participate in your care. Below are specific instructions regarding your future care and recovery. Please take heed of these instructions, as following them closely is essential for your safety, your healing, and the best possible surgical outcome.  Medications: Continue home medications . Managing pain: Tylenol  for mild pain Fioricet  for breakthrough pain Baclofen  as needed for neck pain  Incision Site Care:  Keep it Dry: Do not shower, wash your hair, or let water touch the incision until your follow-up appointment. Water can carry bacteria into the healing wound, which could lead to a serious infection near the brain or spine. Refrain from showering until the incision is completely healed, and the staples/sutures have been removed.  You may see swelling around the surgery site. Stay upright (sitting or walking) during the day and use a wrapped ice pack for 10-15 minutes at a time. Gravity helps fluid drain away from the head naturally. Ice reduces inflammation and numbs discomfort. Watch for Drainage: Check the incision daily. If you notice fluid leaking out (especially if it increases or changes color or odor), call the office. Increasing drainage can be an early warning sign that the wound isn't closing properly, that fluid is building up underneath, or of infection.  Movement:  Move around your home as much as you comfortably can. Movement prevents dangerous blood clots from forming in your legs (DVT) and helps keep your lungs clear. It also jump-starts your digestive system. It is very important for the healing process.   Constipation:  Use over-the-counter aids like Miralax , Senna, or prune juice to ensure your bowel movements are soft and easy. When you bear down or strain to go to the bathroom, it creates a spike in intracranial pressure (pressure inside your skull). You  want to keep the pressure around your brain stable while it heals.  Follow Up:  Call Dr. Calhoun office immediately if you experience: Weakness or Numbness: Especially if its new or only on one side of the body. Speech Issues: Difficulty finding words or slurred speech. Vision Changes: Blurred, double, or lost vision. Balance Issues: Feeling suddenly dizzy or unable to walk. If the doctor took a biopsy or tissue sample, the results usually take several days to process. Dr. Rosslyn will review these specific results with you in person during your clinic visit.

## 2024-08-23 ENCOUNTER — Encounter (HOSPITAL_COMMUNITY): Payer: Self-pay | Admitting: Neuroradiology

## 2024-08-23 ENCOUNTER — Inpatient Hospital Stay (HOSPITAL_COMMUNITY)

## 2024-08-23 DIAGNOSIS — Z9889 Other specified postprocedural states: Secondary | ICD-10-CM

## 2024-08-23 DIAGNOSIS — D352 Benign neoplasm of pituitary gland: Secondary | ICD-10-CM

## 2024-08-23 LAB — BASIC METABOLIC PANEL WITH GFR
Anion gap: 12 (ref 5–15)
BUN: 16 mg/dL (ref 6–20)
CO2: 23 mmol/L (ref 22–32)
Calcium: 9.1 mg/dL (ref 8.9–10.3)
Chloride: 104 mmol/L (ref 98–111)
Creatinine, Ser: 0.72 mg/dL (ref 0.61–1.24)
GFR, Estimated: >60 mL/min
Glucose, Bld: 125 mg/dL — ABNORMAL HIGH (ref 70–99)
Potassium: 4.2 mmol/L (ref 3.5–5.1)
Sodium: 138 mmol/L (ref 135–145)

## 2024-08-23 LAB — GLUCOSE, CAPILLARY
Glucose-Capillary: 109 mg/dL — ABNORMAL HIGH (ref 70–99)
Glucose-Capillary: 124 mg/dL — ABNORMAL HIGH (ref 70–99)
Glucose-Capillary: 129 mg/dL — ABNORMAL HIGH (ref 70–99)
Glucose-Capillary: 156 mg/dL — ABNORMAL HIGH (ref 70–99)
Glucose-Capillary: 164 mg/dL — ABNORMAL HIGH (ref 70–99)

## 2024-08-23 LAB — POCT I-STAT 7, (LYTES, BLD GAS, ICA,H+H)
Acid-base deficit: 3 mmol/L — ABNORMAL HIGH (ref 0.0–2.0)
Acid-base deficit: 5 mmol/L — ABNORMAL HIGH (ref 0.0–2.0)
Bicarbonate: 20.3 mmol/L (ref 20.0–28.0)
Bicarbonate: 18.4 mmol/L — ABNORMAL LOW (ref 20.0–28.0)
Calcium, Ion: 1.17 mmol/L (ref 1.15–1.40)
Calcium, Ion: 1.11 mmol/L — ABNORMAL LOW (ref 1.15–1.40)
HCT: 36 % — ABNORMAL LOW (ref 39.0–52.0)
HCT: 30 % — ABNORMAL LOW (ref 39.0–52.0)
Hemoglobin: 12.2 g/dL — ABNORMAL LOW (ref 13.0–17.0)
Hemoglobin: 10.2 g/dL — ABNORMAL LOW (ref 13.0–17.0)
O2 Saturation: 100 %
O2 Saturation: 100 %
Potassium: 2.8 mmol/L — ABNORMAL LOW (ref 3.5–5.1)
Potassium: 3.4 mmol/L — ABNORMAL LOW (ref 3.5–5.1)
Sodium: 143 mmol/L (ref 135–145)
Sodium: 143 mmol/L (ref 135–145)
TCO2: 21 mmol/L — ABNORMAL LOW (ref 22–32)
TCO2: 19 mmol/L — ABNORMAL LOW (ref 22–32)
pCO2 arterial: 29.3 mmHg — ABNORMAL LOW (ref 32–48)
pCO2 arterial: 26.7 mmHg — ABNORMAL LOW (ref 32–48)
pH, Arterial: 7.449 (ref 7.35–7.45)
pH, Arterial: 7.447 (ref 7.35–7.45)
pO2, Arterial: 311 mmHg — ABNORMAL HIGH (ref 83–108)
pO2, Arterial: 272 mmHg — ABNORMAL HIGH (ref 83–108)

## 2024-08-23 LAB — MRSA NEXT GEN BY PCR, NASAL: MRSA by PCR Next Gen: NOT DETECTED

## 2024-08-23 LAB — SURGICAL PATHOLOGY

## 2024-08-23 MED ORDER — HEPARIN SODIUM (PORCINE) 1000 UNIT/ML IJ SOLN
INTRAMUSCULAR | Status: AC | PRN
  Administered 2024-08-23: 3000 [IU] via INTRAVENOUS

## 2024-08-23 MED ORDER — IOHEXOL 300 MG/ML  SOLN
100.0000 mL | Freq: Once | INTRAMUSCULAR | Status: AC | PRN
  Administered 2024-08-23: 35 mL via INTRA_ARTERIAL

## 2024-08-23 MED ORDER — NITROGLYCERIN 1 MG/10 ML FOR IR/CATH LAB
INTRA_ARTERIAL | Status: AC
  Filled 2024-08-23: qty 10

## 2024-08-23 MED ORDER — LIDOCAINE HCL 1 % IJ SOLN
INTRAMUSCULAR | Status: AC
  Filled 2024-08-23: qty 20

## 2024-08-23 MED ORDER — VERAPAMIL HCL 2.5 MG/ML IV SOLN
INTRAVENOUS | Status: AC
  Filled 2024-08-23: qty 2

## 2024-08-23 MED ORDER — VERAPAMIL HCL 2.5 MG/ML IV SOLN: INTRA_ARTERIAL | Status: AC | PRN

## 2024-08-23 MED ORDER — AMLODIPINE BESYLATE 2.5 MG PO TABS
2.5000 mg | ORAL_TABLET | Freq: Every day | ORAL | Status: DC
  Administered 2024-08-24: 2.5 mg via ORAL
  Filled 2024-08-23: qty 1

## 2024-08-23 MED ORDER — FENTANYL CITRATE (PF) 100 MCG/2ML IJ SOLN
INTRAMUSCULAR | Status: AC | PRN
  Administered 2024-08-23: 50 ug via INTRAVENOUS

## 2024-08-23 MED ORDER — HEPARIN SODIUM (PORCINE) 1000 UNIT/ML IJ SOLN
INTRAMUSCULAR | Status: AC
  Filled 2024-08-23: qty 10

## 2024-08-23 MED ORDER — LEVETIRACETAM 500 MG PO TABS
500.0000 mg | ORAL_TABLET | Freq: Two times a day (BID) | ORAL | Status: DC
  Administered 2024-08-23 – 2024-08-24 (×2): 500 mg via ORAL
  Filled 2024-08-23 (×2): qty 1

## 2024-08-23 MED ORDER — FENTANYL CITRATE (PF) 100 MCG/2ML IJ SOLN
INTRAMUSCULAR | Status: AC
  Filled 2024-08-23: qty 2

## 2024-08-23 MED ORDER — LIDOCAINE HCL 1 % IJ SOLN
20.0000 mL | Freq: Once | INTRAMUSCULAR | Status: DC
  Filled 2024-08-23: qty 20

## 2024-08-23 MED ORDER — PANTOPRAZOLE SODIUM 40 MG PO TBEC
40.0000 mg | DELAYED_RELEASE_TABLET | Freq: Every day | ORAL | Status: DC
  Administered 2024-08-23: 40 mg via ORAL
  Filled 2024-08-23: qty 1

## 2024-08-23 NOTE — Plan of Care (Signed)

## 2024-08-23 NOTE — Sedation Documentation (Signed)
 A-line disconnected and will use for procedure then remove completely verbal order Dr Lester

## 2024-08-23 NOTE — Brief Op Note (Signed)
" °  NEUROSURGERY BRIEF OP NOTE   PREOP DX: Follow-up AVM  POSTOP DX: Same  PROCEDURE: Left carotid angiogram  SURGEON: Nancyann LULLA Burns   ANESTHESIA: IV Sedation with Local  EBL: 10  COMPLICATIONS: None  CONDITION: Stable  FINDINGS:  Complete treatment of AVM  Nancyann LULLA Burns  @today @ 9:11 AM  "

## 2024-08-24 LAB — BASIC METABOLIC PANEL WITH GFR
Anion gap: 10 (ref 5–15)
BUN: 20 mg/dL (ref 6–20)
CO2: 26 mmol/L (ref 22–32)
Calcium: 9.5 mg/dL (ref 8.9–10.3)
Chloride: 105 mmol/L (ref 98–111)
Creatinine, Ser: 0.76 mg/dL (ref 0.61–1.24)
GFR, Estimated: >60 mL/min
Glucose, Bld: 119 mg/dL — ABNORMAL HIGH (ref 70–99)
Potassium: 4.3 mmol/L (ref 3.5–5.1)
Sodium: 141 mmol/L (ref 135–145)

## 2024-08-24 LAB — GLUCOSE, CAPILLARY
Glucose-Capillary: 121 mg/dL — ABNORMAL HIGH (ref 70–99)
Glucose-Capillary: 110 mg/dL — ABNORMAL HIGH (ref 70–99)

## 2024-08-24 MED ORDER — BUTALBITAL-APAP-CAFFEINE 50-325-40 MG PO TABS: 2.0000 | ORAL_TABLET | Freq: Four times a day (QID) | ORAL | 0 refills | Status: AC | PRN

## 2024-08-24 MED ORDER — SODIUM CHLORIDE 0.9 % IV SOLN: 500.0000 mg | Freq: Two times a day (BID) | INTRAVENOUS | 0 refills | Status: DC

## 2024-08-24 MED ORDER — LEVETIRACETAM 500 MG PO TABS: 500.0000 mg | ORAL_TABLET | Freq: Two times a day (BID) | ORAL | 0 refills | Status: AC

## 2024-08-24 MED ORDER — METHYLPREDNISOLONE 4 MG PO TBPK: ORAL_TABLET | ORAL | 0 refills | Status: AC

## 2024-08-24 MED ORDER — BACLOFEN 10 MG PO TABS: 10.0000 mg | ORAL_TABLET | Freq: Three times a day (TID) | ORAL | 0 refills | Status: AC

## 2024-08-24 MED ORDER — BACLOFEN 10 MG PO TABS: 10.0000 mg | ORAL_TABLET | Freq: Two times a day (BID) | ORAL | 1 refills | Status: AC | PRN

## 2024-08-24 NOTE — Progress Notes (Signed)
 Discharge paperwork was reviewed with patient.  All questions were answered to patient's satisfaction.  Ivs were removed. AD Tammy Erika brought patient to discharge lounge in wheelchair.

## 2024-08-24 NOTE — Progress Notes (Signed)
 " Assessment : 53yo gentleman with a LEFT temporal AVM.  Patient underwent resection of the arteriovenous malformation on Monday and on postoperative day 1, which was yesterday, he had a diagnostic angiogram.  Patient was unable to go home yesterday because of the nausea.  Today he feels much better and is eating his breakfast.  He has already ambulated and feels comfortable enough to go home.  Plan : I went over the discharge instructions with him in great detail in the presence of the nurse.  I told him that I want him to keep his incision dry and not expose it to extreme heat or cold.  I told him about the fact that his eye would swell which is normal after surgery but eventually after a week it would get better.  We talked about all the medications that he is going home on: Firstly, he is going to be discharged on Keppra  which I am doing more as a precautionary measure and I have given him enough until he comes back for his staple removal.  I will have my office contact him to adjust the date as it is currently set on the 17 but I think we can do it on the 14th but I will check with my clinic. I have told him that he needs to take a Medrol  Dosepak and explained to them what this entails and to follow the instructions on the pack. I have told him about baclofen  and Fioricet , muscle relaxer and painkiller respectively, and to take this on an as needed basis.  We also talked about blood pressure parameters.  I told him that I do not want his blood pressures diastolic to be higher than 90 and systolic to be above 160.  He is going to check his blood pressure at home and contact his primary care doctor to make sure that his medications are appropriately adjusted.  He wanted to know from me when his pituitary was going to be operated on and I told him that we will cross that bridge when we need to because I want him to recover from the surgery first.  We talked about ambulation and I told him to be  active but not do any heavy lifting or excessive work.  I will see him back for the staple removal.  He is going to ambulate in the hallways and walked 2 laps today.   Social History   Socioeconomic History   Marital status: Married    Spouse name: Not on file   Number of children: Not on file   Years of education: Not on file   Highest education level: Not on file  Occupational History   Not on file  Tobacco Use   Smoking status: Never   Smokeless tobacco: Never  Vaping Use   Vaping status: Never Used  Substance and Sexual Activity   Alcohol use: Not Currently    Comment: occas but none since first of January 2026   Drug use: Never   Sexual activity: Yes  Other Topics Concern   Not on file  Social History Narrative   Not on file   Social Drivers of Health   Tobacco Use: Low Risk (08/19/2024)   Patient History    Smoking Tobacco Use: Never    Smokeless Tobacco Use: Never    Passive Exposure: Not on file  Financial Resource Strain: Not on file  Food Insecurity: No Food Insecurity (08/23/2024)   Epic    Worried About Running Out  of Food in the Last Year: Never true    Ran Out of Food in the Last Year: Never true  Transportation Needs: No Transportation Needs (08/23/2024)   Epic    Lack of Transportation (Medical): No    Lack of Transportation (Non-Medical): No  Physical Activity: Not on file  Stress: Not on file  Social Connections: Not on file  Intimate Partner Violence: Not At Risk (08/23/2024)   Epic    Fear of Current or Ex-Partner: No    Emotionally Abused: No    Physically Abused: No    Sexually Abused: No  Depression (PHQ2-9): Not on file  Alcohol Screen: Not on file  Housing: Low Risk (08/23/2024)   Epic    Unable to Pay for Housing in the Last Year: No    Number of Times Moved in the Last Year: 0    Homeless in the Last Year: No  Utilities: Not At Risk (08/23/2024)   Epic    Threatened with loss of utilities: No  Health Literacy: Not on file    History  reviewed. No pertinent family history.  Allergies[1]  Past Medical History:  Diagnosis Date   Arteriovenous malformation 08/12/2024   recently dx   Hypertension    Pituitary tumor 08/12/2024   recently dx    Past Surgical History:  Procedure Laterality Date   COLONOSCOPY  07/2023   IR ANGIO EXTERNAL CAROTID SEL EXT CAROTID UNI L MOD SED  08/03/2024   IR ANGIO INTRA EXTRACRAN SEL COM CAROTID INNOMINATE UNI R MOD SED  08/03/2024   IR ANGIO INTRA EXTRACRAN SEL INTERNAL CAROTID UNI L MOD SED  08/03/2024   IR ANGIO VERTEBRAL SEL SUBCLAVIAN INNOMINATE UNI R MOD SED  08/03/2024   IR ANGIO VERTEBRAL SEL VERTEBRAL UNI L MOD SED  08/03/2024   IR US  GUIDE VASC ACCESS RIGHT  08/03/2024   RADIOLOGY WITH ANESTHESIA N/A 08/22/2024   Procedure: RADIOLOGY WITH ANESTHESIA;  Surgeon: Lester Golas, MD;  Location: [facility] OR;  Service: Radiology;  Laterality: N/A;  AVM embolization   ROBOT ASSISTED LAPAROSCOPIC NEPHRECTOMY Right 10/15/2023   Procedure: XI ROBOTIC ASSISTED  PARTIAL LAPAROSCOPIC RIGHT NEPHRECTOMY;  Surgeon: Cam Morene ORN, MD;  Location: [facility];  Service: Urology;  Laterality: Right;  210 MINUTE CASE   UPPER GI ENDOSCOPY  08/12/2024     Physical Exam   Physical Exam HENT:     Head: Normocephalic. Swelling LEFT temporal.    Nose: Nose normal.  Eyes:     Pupils: Pupils are equal, round, and reactive to light.  Cardiovascular:     Rate and Rhythm: Normal rate.  Pulmonary:     Effort: Pulmonary effort is normal.  Abdominal:     General: Abdomen is flat.  Musculoskeletal:     Cervical back: Normal range of motion.  Neurological:     Mental Status: Patient is alert.     Cranial Nerves: Cranial nerves 2-12 are intact.     Sensory: Sensation is intact.     Motor: Motor function is intact.     Coordination: Coordination is intact.     Results for orders placed or performed during the hospital encounter of 08/22/24  CT HEAD WO CONTRAST   Narrative   EXAM: CT HEAD WITHOUT  CONTRAST 08/22/2024 03:00:59 PM  TECHNIQUE: CT of the head was performed without the administration of intravenous contrast. Automated exposure control, iterative reconstruction, and/or weight based adjustment of the mA/kV was utilized to reduce the radiation dose to as low as  reasonably achievable.  COMPARISON: MRI head 08/08/2024 and CTA head and neck 07/30/2024.  CLINICAL HISTORY: Postoperative.  FINDINGS:  BRAIN AND VENTRICLES: Sequelae of interval left temporal craniotomy are identified for AVM resection. Gas and minimal hemorrhage or surgical material are noted within a small resection cavity in the left temporal lobe. A small low density fluid collection/hematoma over the left frontal convexity measures up to 4 mm in thickness without significant associated mass effect. Mild pneumocephalus is noted. No acute infarct, midline shift, or hydrocephalus is evident. A sellar and suprasellar mass was more fully evaluated on the prior MRI.  ORBITS: No acute abnormality.  SINUSES: No acute abnormality.  SOFT TISSUES AND SKULL: Left temporal craniotomy with overlying scalp drain, swelling, and skin staples.  IMPRESSION: 1. Expected postoperative changes from interval left temporal AVM resection as detailed above. 2. Known pituitary adenoma.  Electronically signed by: Dasie Hamburg MD 08/22/2024 04:13 PM EST RP Workstation: [id number]   Results for orders placed or performed during the hospital encounter of 08/08/24  MR BRAIN W WO CONTRAST   Narrative   CLINICAL DATA:  Pituitary adenoma  EXAM: MRI HEAD WITHOUT AND WITH CONTRAST  TECHNIQUE: Multiplanar, multiecho pulse sequences of the brain and surrounding structures were obtained without and with intravenous contrast.  CONTRAST:  5mL GADAVIST  GADOBUTROL  1 MMOL/ML IV SOLN  COMPARISON:  None Available.  FINDINGS: MRI brain:  There is a 1.6 by 1.6 x 2 cm pituitary adenoma. This extends into the suprasellar space  and slightly elevates the optic chiasm.  There are several small foci of T2 hyperintensity in the cerebral white matter. These do not have restricted diffusion.  No abnormal enhancement.  There is no acute or chronic infarct.  The ventricles are normal.  The there are some enhancing vessels in the left anterior temporal lobe  No significant bone marrow signal abnormality.  No significant abnormality in the paranasal sinuses or soft tissues.  IMPRESSION: 1.6 x 1.6 x 2 cm pituitary adenoma with extension into the suprasellar space with elevation of the optic chiasm  Small left anterior temporal arteriovenous malformation as identified on previous arteriogram.   Electronically Signed   By: Nancyann Burns M.D.   On: 08/11/2024 14:24        [1] No Known Allergies  "

## 2024-08-26 ENCOUNTER — Encounter (HOSPITAL_COMMUNITY): Payer: Self-pay | Admitting: Neurosurgery

## 2024-09-02 ENCOUNTER — Encounter: Admitting: Neurosurgery

## 2024-09-06 ENCOUNTER — Encounter: Admitting: Neurosurgery
